# Patient Record
Sex: Male | Born: 1965 | Race: White | Hispanic: No | Smoking: Current every day smoker
Health system: Southern US, Community
[De-identification: ages and names within clinical notes are randomized; demographics above are authoritative.]

## PROBLEM LIST (undated history)

## (undated) DIAGNOSIS — R042 Hemoptysis: Secondary | ICD-10-CM

## (undated) DIAGNOSIS — R519 Headache, unspecified: Secondary | ICD-10-CM

## (undated) DIAGNOSIS — J40 Bronchitis, not specified as acute or chronic: Secondary | ICD-10-CM

## (undated) DIAGNOSIS — F1910 Other psychoactive substance abuse, uncomplicated: Secondary | ICD-10-CM

## (undated) DIAGNOSIS — F419 Anxiety disorder, unspecified: Secondary | ICD-10-CM

## (undated) DIAGNOSIS — Z87442 Personal history of urinary calculi: Secondary | ICD-10-CM

## (undated) DIAGNOSIS — N400 Enlarged prostate without lower urinary tract symptoms: Secondary | ICD-10-CM

## (undated) DIAGNOSIS — N200 Calculus of kidney: Secondary | ICD-10-CM

## (undated) HISTORY — PX: NECK SURGERY: SHX720

## (undated) HISTORY — PX: OTHER SURGICAL HISTORY: SHX169

## (undated) HISTORY — DX: Hemoptysis: R04.2

---

## 1996-02-01 HISTORY — PX: NECK SURGERY: SHX720

## 1997-01-31 HISTORY — PX: NECK SURGERY: SHX720

## 1997-12-31 ENCOUNTER — Observation Stay (HOSPITAL_COMMUNITY): Admission: EM | Admit: 1997-12-31 | Discharge: 1998-01-01 | Payer: Self-pay | Admitting: Emergency Medicine

## 1998-06-11 ENCOUNTER — Emergency Department (HOSPITAL_COMMUNITY): Admission: EM | Admit: 1998-06-11 | Discharge: 1998-06-11 | Payer: Self-pay | Admitting: Emergency Medicine

## 1998-06-11 ENCOUNTER — Encounter: Payer: Self-pay | Admitting: Emergency Medicine

## 1998-12-29 ENCOUNTER — Encounter: Payer: Self-pay | Admitting: Orthopedic Surgery

## 1998-12-29 ENCOUNTER — Ambulatory Visit (HOSPITAL_COMMUNITY): Admission: RE | Admit: 1998-12-29 | Discharge: 1998-12-29 | Payer: Self-pay | Admitting: Orthopedic Surgery

## 1999-01-13 ENCOUNTER — Ambulatory Visit (HOSPITAL_COMMUNITY): Admission: RE | Admit: 1999-01-13 | Discharge: 1999-01-13 | Payer: Self-pay | Admitting: Orthopedic Surgery

## 1999-01-13 ENCOUNTER — Encounter: Payer: Self-pay | Admitting: Orthopedic Surgery

## 1999-02-05 ENCOUNTER — Inpatient Hospital Stay (HOSPITAL_COMMUNITY): Admission: RE | Admit: 1999-02-05 | Discharge: 1999-02-06 | Payer: Self-pay | Admitting: Neurosurgery

## 1999-02-05 ENCOUNTER — Encounter: Payer: Self-pay | Admitting: Neurosurgery

## 1999-02-22 ENCOUNTER — Encounter: Payer: Self-pay | Admitting: Neurosurgery

## 1999-02-22 ENCOUNTER — Encounter: Admission: RE | Admit: 1999-02-22 | Discharge: 1999-02-22 | Payer: Self-pay | Admitting: Neurosurgery

## 1999-03-17 ENCOUNTER — Encounter: Admission: RE | Admit: 1999-03-17 | Discharge: 1999-03-17 | Payer: Self-pay | Admitting: Neurosurgery

## 1999-03-17 ENCOUNTER — Encounter: Payer: Self-pay | Admitting: Neurosurgery

## 1999-04-19 ENCOUNTER — Encounter: Admission: RE | Admit: 1999-04-19 | Discharge: 1999-04-19 | Payer: Self-pay | Admitting: Neurosurgery

## 1999-04-19 ENCOUNTER — Encounter: Payer: Self-pay | Admitting: Neurosurgery

## 1999-04-26 ENCOUNTER — Encounter: Payer: Self-pay | Admitting: Emergency Medicine

## 1999-04-26 ENCOUNTER — Emergency Department (HOSPITAL_COMMUNITY): Admission: EM | Admit: 1999-04-26 | Discharge: 1999-04-26 | Payer: Self-pay | Admitting: Emergency Medicine

## 1999-05-26 ENCOUNTER — Encounter: Payer: Self-pay | Admitting: Neurosurgery

## 1999-05-26 ENCOUNTER — Ambulatory Visit (HOSPITAL_COMMUNITY): Admission: RE | Admit: 1999-05-26 | Discharge: 1999-05-26 | Payer: Self-pay | Admitting: Neurosurgery

## 1999-06-09 ENCOUNTER — Encounter: Payer: Self-pay | Admitting: Neurosurgery

## 1999-06-11 ENCOUNTER — Encounter: Payer: Self-pay | Admitting: Neurosurgery

## 1999-06-11 ENCOUNTER — Inpatient Hospital Stay (HOSPITAL_COMMUNITY): Admission: RE | Admit: 1999-06-11 | Discharge: 1999-06-12 | Payer: Self-pay | Admitting: Neurosurgery

## 1999-06-22 ENCOUNTER — Encounter: Payer: Self-pay | Admitting: Neurosurgery

## 1999-06-22 ENCOUNTER — Encounter: Admission: RE | Admit: 1999-06-22 | Discharge: 1999-06-22 | Payer: Self-pay | Admitting: Neurosurgery

## 1999-08-03 ENCOUNTER — Encounter: Admission: RE | Admit: 1999-08-03 | Discharge: 1999-08-03 | Payer: Self-pay | Admitting: Neurosurgery

## 1999-08-03 ENCOUNTER — Encounter: Payer: Self-pay | Admitting: Neurosurgery

## 1999-09-20 ENCOUNTER — Encounter: Payer: Self-pay | Admitting: Neurosurgery

## 1999-09-20 ENCOUNTER — Encounter: Admission: RE | Admit: 1999-09-20 | Discharge: 1999-09-20 | Payer: Self-pay | Admitting: Neurosurgery

## 2000-04-23 ENCOUNTER — Encounter: Payer: Self-pay | Admitting: Neurosurgery

## 2000-04-23 ENCOUNTER — Ambulatory Visit (HOSPITAL_COMMUNITY): Admission: RE | Admit: 2000-04-23 | Discharge: 2000-04-23 | Payer: Self-pay | Admitting: Neurosurgery

## 2000-05-03 ENCOUNTER — Emergency Department (HOSPITAL_COMMUNITY): Admission: EM | Admit: 2000-05-03 | Discharge: 2000-05-03 | Payer: Self-pay | Admitting: Emergency Medicine

## 2000-06-02 ENCOUNTER — Encounter: Admission: RE | Admit: 2000-06-02 | Discharge: 2000-06-02 | Payer: Self-pay | Admitting: Neurosurgery

## 2000-06-02 ENCOUNTER — Encounter: Payer: Self-pay | Admitting: Neurosurgery

## 2000-11-08 ENCOUNTER — Encounter: Payer: Self-pay | Admitting: Neurosurgery

## 2000-11-08 ENCOUNTER — Encounter: Admission: RE | Admit: 2000-11-08 | Discharge: 2000-11-08 | Payer: Self-pay | Admitting: Neurosurgery

## 2001-03-27 ENCOUNTER — Encounter: Payer: Self-pay | Admitting: General Practice

## 2001-03-27 ENCOUNTER — Encounter: Admission: RE | Admit: 2001-03-27 | Discharge: 2001-03-27 | Payer: Self-pay | Admitting: General Practice

## 2001-05-14 ENCOUNTER — Ambulatory Visit (HOSPITAL_COMMUNITY): Admission: RE | Admit: 2001-05-14 | Discharge: 2001-05-14 | Payer: Self-pay | Admitting: Internal Medicine

## 2001-05-14 ENCOUNTER — Encounter: Payer: Self-pay | Admitting: Internal Medicine

## 2001-09-29 ENCOUNTER — Emergency Department (HOSPITAL_COMMUNITY): Admission: EM | Admit: 2001-09-29 | Discharge: 2001-09-29 | Payer: Self-pay | Admitting: Emergency Medicine

## 2001-09-29 ENCOUNTER — Encounter: Payer: Self-pay | Admitting: Emergency Medicine

## 2001-10-04 ENCOUNTER — Emergency Department (HOSPITAL_COMMUNITY): Admission: EM | Admit: 2001-10-04 | Discharge: 2001-10-04 | Payer: Self-pay | Admitting: Emergency Medicine

## 2001-10-09 ENCOUNTER — Emergency Department (HOSPITAL_COMMUNITY): Admission: EM | Admit: 2001-10-09 | Discharge: 2001-10-09 | Payer: Self-pay | Admitting: Emergency Medicine

## 2001-11-15 ENCOUNTER — Emergency Department (HOSPITAL_COMMUNITY): Admission: EM | Admit: 2001-11-15 | Discharge: 2001-11-15 | Payer: Self-pay | Admitting: Emergency Medicine

## 2002-01-28 ENCOUNTER — Emergency Department (HOSPITAL_COMMUNITY): Admission: EM | Admit: 2002-01-28 | Discharge: 2002-01-28 | Payer: Self-pay

## 2002-03-20 ENCOUNTER — Emergency Department (HOSPITAL_COMMUNITY): Admission: EM | Admit: 2002-03-20 | Discharge: 2002-03-20 | Payer: Self-pay | Admitting: Emergency Medicine

## 2002-03-20 ENCOUNTER — Encounter: Payer: Self-pay | Admitting: Emergency Medicine

## 2002-06-26 ENCOUNTER — Emergency Department (HOSPITAL_COMMUNITY): Admission: EM | Admit: 2002-06-26 | Discharge: 2002-06-26 | Payer: Self-pay | Admitting: Emergency Medicine

## 2002-08-25 ENCOUNTER — Emergency Department (HOSPITAL_COMMUNITY): Admission: EM | Admit: 2002-08-25 | Discharge: 2002-08-25 | Payer: Self-pay | Admitting: Emergency Medicine

## 2003-01-08 ENCOUNTER — Emergency Department (HOSPITAL_COMMUNITY): Admission: EM | Admit: 2003-01-08 | Discharge: 2003-01-08 | Payer: Self-pay | Admitting: Emergency Medicine

## 2003-03-09 ENCOUNTER — Emergency Department (HOSPITAL_COMMUNITY): Admission: AD | Admit: 2003-03-09 | Discharge: 2003-03-09 | Payer: Self-pay | Admitting: Family Medicine

## 2003-04-01 ENCOUNTER — Emergency Department (HOSPITAL_COMMUNITY): Admission: EM | Admit: 2003-04-01 | Discharge: 2003-04-01 | Payer: Self-pay | Admitting: Family Medicine

## 2003-04-03 ENCOUNTER — Encounter: Admission: RE | Admit: 2003-04-03 | Discharge: 2003-04-10 | Payer: Self-pay | Admitting: Occupational Medicine

## 2003-04-26 ENCOUNTER — Emergency Department (HOSPITAL_COMMUNITY): Admission: AD | Admit: 2003-04-26 | Discharge: 2003-04-26 | Payer: Self-pay | Admitting: Family Medicine

## 2003-05-07 ENCOUNTER — Emergency Department (HOSPITAL_COMMUNITY): Admission: EM | Admit: 2003-05-07 | Discharge: 2003-05-07 | Payer: Self-pay

## 2003-06-20 ENCOUNTER — Emergency Department (HOSPITAL_COMMUNITY): Admission: EM | Admit: 2003-06-20 | Discharge: 2003-06-20 | Payer: Self-pay | Admitting: Emergency Medicine

## 2003-08-07 ENCOUNTER — Emergency Department (HOSPITAL_COMMUNITY): Admission: EM | Admit: 2003-08-07 | Discharge: 2003-08-07 | Payer: Self-pay | Admitting: Emergency Medicine

## 2004-01-27 ENCOUNTER — Ambulatory Visit (HOSPITAL_COMMUNITY): Admission: RE | Admit: 2004-01-27 | Discharge: 2004-01-27 | Payer: Self-pay | Admitting: Internal Medicine

## 2004-10-31 ENCOUNTER — Emergency Department (HOSPITAL_COMMUNITY): Admission: EM | Admit: 2004-10-31 | Discharge: 2004-10-31 | Payer: Self-pay | Admitting: Emergency Medicine

## 2004-11-10 ENCOUNTER — Emergency Department (HOSPITAL_COMMUNITY): Admission: EM | Admit: 2004-11-10 | Discharge: 2004-11-10 | Payer: Self-pay | Admitting: Emergency Medicine

## 2004-12-03 ENCOUNTER — Emergency Department (HOSPITAL_COMMUNITY): Admission: EM | Admit: 2004-12-03 | Discharge: 2004-12-03 | Payer: Self-pay | Admitting: Emergency Medicine

## 2005-04-29 ENCOUNTER — Emergency Department (HOSPITAL_COMMUNITY): Admission: EM | Admit: 2005-04-29 | Discharge: 2005-04-29 | Payer: Self-pay | Admitting: Family Medicine

## 2005-04-29 ENCOUNTER — Ambulatory Visit (HOSPITAL_COMMUNITY): Admission: RE | Admit: 2005-04-29 | Discharge: 2005-04-29 | Payer: Self-pay | Admitting: Family Medicine

## 2005-05-19 ENCOUNTER — Emergency Department (HOSPITAL_COMMUNITY): Admission: EM | Admit: 2005-05-19 | Discharge: 2005-05-19 | Payer: Self-pay | Admitting: Family Medicine

## 2005-05-29 ENCOUNTER — Emergency Department (HOSPITAL_COMMUNITY): Admission: EM | Admit: 2005-05-29 | Discharge: 2005-05-29 | Payer: Self-pay | Admitting: Emergency Medicine

## 2005-05-30 ENCOUNTER — Ambulatory Visit (HOSPITAL_COMMUNITY): Admission: RE | Admit: 2005-05-30 | Discharge: 2005-05-30 | Payer: Self-pay | Admitting: Family Medicine

## 2005-06-05 ENCOUNTER — Emergency Department (HOSPITAL_COMMUNITY): Admission: EM | Admit: 2005-06-05 | Discharge: 2005-06-05 | Payer: Self-pay | Admitting: Emergency Medicine

## 2005-06-06 ENCOUNTER — Emergency Department (HOSPITAL_COMMUNITY): Admission: EM | Admit: 2005-06-06 | Discharge: 2005-06-06 | Payer: Self-pay | Admitting: Family Medicine

## 2005-06-12 ENCOUNTER — Emergency Department (HOSPITAL_COMMUNITY): Admission: EM | Admit: 2005-06-12 | Discharge: 2005-06-12 | Payer: Self-pay | Admitting: Family Medicine

## 2005-06-26 ENCOUNTER — Emergency Department (HOSPITAL_COMMUNITY): Admission: EM | Admit: 2005-06-26 | Discharge: 2005-06-26 | Payer: Self-pay | Admitting: Emergency Medicine

## 2005-06-27 ENCOUNTER — Emergency Department (HOSPITAL_COMMUNITY): Admission: EM | Admit: 2005-06-27 | Discharge: 2005-06-27 | Payer: Self-pay | Admitting: Emergency Medicine

## 2006-08-10 ENCOUNTER — Emergency Department (HOSPITAL_COMMUNITY): Admission: EM | Admit: 2006-08-10 | Discharge: 2006-08-10 | Payer: Self-pay | Admitting: Emergency Medicine

## 2008-01-29 ENCOUNTER — Emergency Department (HOSPITAL_COMMUNITY): Admission: EM | Admit: 2008-01-29 | Discharge: 2008-01-29 | Payer: Self-pay | Admitting: Adult Health

## 2008-10-14 ENCOUNTER — Emergency Department (HOSPITAL_BASED_OUTPATIENT_CLINIC_OR_DEPARTMENT_OTHER): Admission: EM | Admit: 2008-10-14 | Discharge: 2008-10-14 | Payer: Self-pay | Admitting: Emergency Medicine

## 2008-10-14 ENCOUNTER — Ambulatory Visit: Payer: Self-pay | Admitting: Diagnostic Radiology

## 2008-10-19 ENCOUNTER — Ambulatory Visit: Payer: Self-pay | Admitting: Diagnostic Radiology

## 2008-10-19 ENCOUNTER — Emergency Department (HOSPITAL_BASED_OUTPATIENT_CLINIC_OR_DEPARTMENT_OTHER): Admission: EM | Admit: 2008-10-19 | Discharge: 2008-10-19 | Payer: Self-pay | Admitting: Emergency Medicine

## 2009-02-06 ENCOUNTER — Emergency Department (HOSPITAL_COMMUNITY): Admission: EM | Admit: 2009-02-06 | Discharge: 2009-02-06 | Payer: Self-pay | Admitting: Emergency Medicine

## 2009-04-19 ENCOUNTER — Emergency Department (HOSPITAL_BASED_OUTPATIENT_CLINIC_OR_DEPARTMENT_OTHER): Admission: EM | Admit: 2009-04-19 | Discharge: 2009-04-20 | Payer: Self-pay | Admitting: Emergency Medicine

## 2009-04-20 ENCOUNTER — Ambulatory Visit: Payer: Self-pay | Admitting: Interventional Radiology

## 2009-08-09 ENCOUNTER — Ambulatory Visit: Payer: Self-pay | Admitting: Diagnostic Radiology

## 2009-08-09 ENCOUNTER — Emergency Department (HOSPITAL_BASED_OUTPATIENT_CLINIC_OR_DEPARTMENT_OTHER)
Admission: EM | Admit: 2009-08-09 | Discharge: 2009-08-09 | Payer: Self-pay | Source: Home / Self Care | Admitting: Emergency Medicine

## 2009-09-30 ENCOUNTER — Emergency Department (HOSPITAL_BASED_OUTPATIENT_CLINIC_OR_DEPARTMENT_OTHER)
Admission: EM | Admit: 2009-09-30 | Discharge: 2009-09-30 | Payer: Self-pay | Source: Home / Self Care | Admitting: Emergency Medicine

## 2009-11-16 ENCOUNTER — Ambulatory Visit: Payer: Self-pay | Admitting: Diagnostic Radiology

## 2009-11-16 ENCOUNTER — Emergency Department (HOSPITAL_BASED_OUTPATIENT_CLINIC_OR_DEPARTMENT_OTHER)
Admission: EM | Admit: 2009-11-16 | Discharge: 2009-11-16 | Payer: Self-pay | Source: Home / Self Care | Admitting: Emergency Medicine

## 2009-11-23 ENCOUNTER — Emergency Department (HOSPITAL_BASED_OUTPATIENT_CLINIC_OR_DEPARTMENT_OTHER)
Admission: EM | Admit: 2009-11-23 | Discharge: 2009-11-23 | Payer: Self-pay | Source: Home / Self Care | Admitting: Emergency Medicine

## 2009-11-24 ENCOUNTER — Ambulatory Visit: Payer: Self-pay | Admitting: Family

## 2009-11-24 DIAGNOSIS — M79609 Pain in unspecified limb: Secondary | ICD-10-CM

## 2009-11-24 DIAGNOSIS — M542 Cervicalgia: Secondary | ICD-10-CM

## 2009-11-24 DIAGNOSIS — F411 Generalized anxiety disorder: Secondary | ICD-10-CM | POA: Insufficient documentation

## 2009-12-21 ENCOUNTER — Emergency Department (HOSPITAL_COMMUNITY): Admission: EM | Admit: 2009-12-21 | Discharge: 2009-12-21 | Payer: Self-pay | Admitting: Emergency Medicine

## 2009-12-23 ENCOUNTER — Encounter (INDEPENDENT_AMBULATORY_CARE_PROVIDER_SITE_OTHER): Payer: Self-pay | Admitting: *Deleted

## 2010-02-20 ENCOUNTER — Encounter: Payer: Self-pay | Admitting: Family Medicine

## 2010-02-21 ENCOUNTER — Encounter: Payer: Self-pay | Admitting: Orthopedic Surgery
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
## 2010-03-02 NOTE — Letter (Signed)
Summary: [place] No Show Letter  [place] [facility]  [address]   [place], [place] [address]   Phone: [phone]  Fax: [phone]    12/23/2009 MRN: [id number]  Darrell Matthews [address], [place]  [address]   Dear [name],   Our records indicate that you missed your scheduled appointment with Macario Carls on 12-21-2009.  Please contact this office to reschedule your appointment as soon as possible.  It is important that you keep your scheduled appointments with your physician, so we can provide you the best care possible.  Please be advised that there may be a charge for "no show" appointments.    Sincerely,   [place] at [facility]

## 2010-04-14 LAB — POCT CARDIAC MARKERS
CKMB, poc: <1 ng/mL — ABNORMAL LOW (ref 1.0–8.0)
Myoglobin, poc: 39.5 ng/mL (ref 12–200)
Troponin i, poc: <0.05 ng/mL (ref 0.00–0.09)

## 2010-04-14 LAB — POCT I-STAT, CHEM 8
BUN: 12 mg/dL (ref 6–23)
Calcium, Ion: 1.17 mmol/L (ref 1.12–1.32)
Chloride: 107 mEq/L (ref 96–112)
Creatinine, Ser: 0.8 mg/dL (ref 0.4–1.5)
Glucose, Bld: 99 mg/dL (ref 70–99)
HCT: 48 % (ref 39.0–52.0)
Hemoglobin: 16.3 g/dL (ref 13.0–17.0)
Potassium: 4.1 mEq/L (ref 3.5–5.1)
Sodium: 142 mEq/L (ref 135–145)
TCO2: 27 mmol/L (ref 0–100)

## 2010-04-18 LAB — SEDIMENTATION RATE: Sed Rate: 10 mm/hr (ref 0–16)

## 2010-05-07 ENCOUNTER — Emergency Department (HOSPITAL_BASED_OUTPATIENT_CLINIC_OR_DEPARTMENT_OTHER)
Admission: EM | Admit: 2010-05-07 | Discharge: 2010-05-07 | Disposition: A | Discharge status: Home / Self Care | Payer: Self-pay | Attending: Emergency Medicine | Admitting: Emergency Medicine

## 2010-05-07 DIAGNOSIS — G8929 Other chronic pain: Secondary | ICD-10-CM | POA: Insufficient documentation

## 2010-05-07 DIAGNOSIS — X58XXXA Exposure to other specified factors, initial encounter: Secondary | ICD-10-CM | POA: Insufficient documentation

## 2010-05-07 DIAGNOSIS — S335XXA Sprain of ligaments of lumbar spine, initial encounter: Secondary | ICD-10-CM | POA: Insufficient documentation

## 2010-05-07 DIAGNOSIS — Y93H2 Activity, gardening and landscaping: Secondary | ICD-10-CM | POA: Insufficient documentation

## 2010-05-07 DIAGNOSIS — Y92009 Unspecified place in unspecified non-institutional (private) residence as the place of occurrence of the external cause: Secondary | ICD-10-CM | POA: Insufficient documentation

## 2010-05-07 LAB — BASIC METABOLIC PANEL
BUN: 15 mg/dL (ref 6–23)
CO2: 29 mEq/L (ref 19–32)
Calcium: 9.6 mg/dL (ref 8.4–10.5)
Chloride: 105 mEq/L (ref 96–112)
Creatinine, Ser: 0.9 mg/dL (ref 0.4–1.5)
GFR calc Af Amer: >60 mL/min (ref >60)
GFR calc non Af Amer: >60 mL/min (ref >60)
Glucose, Bld: 94 mg/dL (ref 70–99)
Potassium: 4.3 mEq/L (ref 3.5–5.1)
Sodium: 140 mEq/L (ref 135–145)

## 2010-05-07 LAB — CBC
HCT: 43 % (ref 39.0–52.0)
Hemoglobin: 14.6 g/dL (ref 13.0–17.0)
MCHC: 34 g/dL (ref 30.0–36.0)
MCV: 90.7 fL (ref 78.0–100.0)
Platelets: 174 10*3/uL (ref 150–400)
RBC: 4.74 MIL/uL (ref 4.22–5.81)
RDW: 12.9 % (ref 11.5–15.5)
WBC: 10.4 10*3/uL (ref 4.0–10.5)

## 2010-05-07 LAB — DIFFERENTIAL
Basophils Relative: 1 % (ref 0–1)
Eosinophils Absolute: 0.1 10*3/uL (ref 0.0–0.7)
Lymphs Abs: 2.1 10*3/uL (ref 0.7–4.0)
Monocytes Absolute: 0.7 10*3/uL (ref 0.1–1.0)
Monocytes Relative: 7 % (ref 3–12)
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
## 2011-07-26 ENCOUNTER — Encounter (HOSPITAL_BASED_OUTPATIENT_CLINIC_OR_DEPARTMENT_OTHER): Payer: Self-pay

## 2011-07-26 ENCOUNTER — Emergency Department (HOSPITAL_BASED_OUTPATIENT_CLINIC_OR_DEPARTMENT_OTHER): Payer: Self-pay

## 2011-07-26 ENCOUNTER — Emergency Department (HOSPITAL_BASED_OUTPATIENT_CLINIC_OR_DEPARTMENT_OTHER)
Admission: EM | Admit: 2011-07-26 | Discharge: 2011-07-26 | Disposition: A | Discharge status: Home / Self Care | Payer: Self-pay | Attending: Emergency Medicine | Admitting: Emergency Medicine

## 2011-07-26 DIAGNOSIS — S61409A Unspecified open wound of unspecified hand, initial encounter: Secondary | ICD-10-CM | POA: Insufficient documentation

## 2011-07-26 DIAGNOSIS — S61411A Laceration without foreign body of right hand, initial encounter: Secondary | ICD-10-CM

## 2011-07-26 DIAGNOSIS — Y990 Civilian activity done for income or pay: Secondary | ICD-10-CM | POA: Insufficient documentation

## 2011-07-26 DIAGNOSIS — W268XXA Contact with other sharp object(s), not elsewhere classified, initial encounter: Secondary | ICD-10-CM | POA: Insufficient documentation

## 2011-07-26 MED ORDER — OXYCODONE-ACETAMINOPHEN 5-325 MG PO TABS: 1.0000 | ORAL_TABLET | ORAL | Status: AC | PRN

## 2011-07-26 MED ORDER — OXYCODONE-ACETAMINOPHEN 5-325 MG PO TABS
1.0000 | ORAL_TABLET | Freq: Once | ORAL | Status: AC
  Administered 2011-07-26: 1 via ORAL
  Filled 2011-07-26: qty 1

## 2011-07-26 MED ORDER — CEPHALEXIN 250 MG PO CAPS
500.0000 mg | ORAL_CAPSULE | Freq: Once | ORAL | Status: AC
Start: 2011-07-26 — End: 2011-07-26
  Administered 2011-07-26: 500 mg via ORAL
  Filled 2011-07-26: qty 2

## 2011-07-26 MED ORDER — CEPHALEXIN 500 MG PO CAPS: 500.0000 mg | ORAL_CAPSULE | Freq: Four times a day (QID) | ORAL | Status: AC

## 2011-07-26 NOTE — ED Notes (Signed)
Pt c/o Lac to R hand.  Pt states he cut knuckle on chrome band on sewer band last night around 11pm.  Bleeding controlled upon arrival.  Pt states last Tetanus shot was 3 years ago.

## 2011-07-26 NOTE — Discharge Instructions (Signed)
Laceration Care, Adult A laceration is a cut that goes through all layers of the skin. The cut goes into the tissue beneath the skin. HOME CARE For stitches (sutures) or staples:  Keep the cut clean and dry.   If you have a bandage (dressing), change it at least once a day. Change the bandage if it gets wet or dirty, or as told by your doctor.   Wash the cut with soap and water 2 times a day. Rinse the cut with water. Pat it dry with a clean towel.   Put a thin layer of medicated cream on the cut as told by your doctor.   You may shower after the first 24 hours. Do not soak the cut in water until the stitches are removed.   Only take medicines as told by your doctor.   Have your stitches or staples removed as told by your doctor.  For skin adhesive strips:  Keep the cut clean and dry.   Do not get the strips wet. You may take a bath, but be careful to keep the cut dry.   If the cut gets wet, pat it dry with a clean towel.   The strips will fall off on their own. Do not remove the strips that are still stuck to the cut.  For wound glue:  You may shower or take baths. Do not soak or scrub the cut. Do not swim. Avoid heavy sweating until the glue falls off on its own. After a shower or bath, pat the cut dry with a clean towel.   Do not put medicine on your cut until the glue falls off.   If you have a bandage, do not put tape over the glue.   Avoid lots of sunlight or tanning lamps until the glue falls off. Put sunscreen on the cut for the first year to reduce your scar.   The glue will fall off on its own. Do not pick at the glue.  You may need a tetanus shot if:  You cannot remember when you had your last tetanus shot.   You have never had a tetanus shot.  If you need a tetanus shot and you choose not to have one, you may get tetanus. Sickness from tetanus can be serious. GET HELP RIGHT AWAY IF:   Your pain does not get better with medicine.   Your arm, hand, leg, or  foot loses feeling (numbness) or changes color.   Your cut is bleeding.   Your joint feels weak, or you cannot use your joint.   You have painful lumps on your body.   Your cut is red, puffy (swollen), or painful.   You have a red line on the skin near the cut.   You have yellowish-white fluid (pus) coming from the cut.   You have a fever.   You have a bad smell coming from the cut or bandage.   Your cut breaks open before or after stitches are removed.   You notice something coming out of the cut, such as wood or glass.   You cannot move a finger or toe.  MAKE SURE YOU:   Understand these instructions.   Will watch your condition.   Will get help right away if you are not doing well or get worse.  Document Released: 07/06/2007 Document Revised: 01/06/2011 Document Reviewed: 07/13/2010 [facility] Patient Information 2012 [employer]   MR. Driggs, YOU HAVE AN INFECTED LACERATION ON THE RIGHT HAND.  YOU HAVE HAD A SPLINT APPLIED TO IMMOBILIZE YOUR HAND, AND HAVE BEEN GIVEN A SLING TO KEEP YOUR RIGHT HAND ELEVATED.  TAKE THE ANTIBIOTIC KEFLEX 500 MG FOUR TIMES PER DAY.  TAKE THE PAIN MEDICINE PERCOCET EVERY 4 HOURS IF NEEDED FOR PAIN.  CALL DR. MARK YATES' OFFICE THIS AFTERNOON TO MAKE AN APPOINTMENT FOR HIM TO SEE YOU TOMORROW.
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
## 2011-07-26 NOTE — ED Notes (Signed)
Patient transported to X-ray 

## 2011-08-11 ENCOUNTER — Emergency Department (HOSPITAL_BASED_OUTPATIENT_CLINIC_OR_DEPARTMENT_OTHER)
Admission: EM | Admit: 2011-08-11 | Discharge: 2011-08-11 | Disposition: A | Discharge status: Home / Self Care | Payer: Self-pay | Attending: Emergency Medicine | Admitting: Emergency Medicine

## 2011-08-11 ENCOUNTER — Emergency Department (HOSPITAL_BASED_OUTPATIENT_CLINIC_OR_DEPARTMENT_OTHER): Payer: Self-pay

## 2011-08-11 ENCOUNTER — Encounter (HOSPITAL_BASED_OUTPATIENT_CLINIC_OR_DEPARTMENT_OTHER): Payer: Self-pay

## 2011-08-11 DIAGNOSIS — M549 Dorsalgia, unspecified: Secondary | ICD-10-CM | POA: Insufficient documentation

## 2011-08-11 DIAGNOSIS — K859 Acute pancreatitis without necrosis or infection, unspecified: Secondary | ICD-10-CM | POA: Insufficient documentation

## 2011-08-11 DIAGNOSIS — R1011 Right upper quadrant pain: Secondary | ICD-10-CM | POA: Insufficient documentation

## 2011-08-11 DIAGNOSIS — R6883 Chills (without fever): Secondary | ICD-10-CM | POA: Insufficient documentation

## 2011-08-11 DIAGNOSIS — R110 Nausea: Secondary | ICD-10-CM | POA: Insufficient documentation

## 2011-08-11 DIAGNOSIS — F17200 Nicotine dependence, unspecified, uncomplicated: Secondary | ICD-10-CM | POA: Insufficient documentation

## 2011-08-11 LAB — CBC WITH DIFFERENTIAL/PLATELET
Eosinophils Absolute: 0 10*3/uL (ref 0.0–0.7)
Hemoglobin: 15.8 g/dL (ref 13.0–17.0)
Lymphocytes Relative: 15 % (ref 12–46)
Lymphs Abs: 2.1 10*3/uL (ref 0.7–4.0)
MCH: 31.6 pg (ref 26.0–34.0)
MCV: 90 fL (ref 78.0–100.0)
Monocytes Relative: 6 % (ref 3–12)
Neutrophils Relative %: 79 % — ABNORMAL HIGH (ref 43–77)
Platelets: 197 10*3/uL (ref 150–400)
RBC: 5 MIL/uL (ref 4.22–5.81)
WBC: 13.9 10*3/uL — ABNORMAL HIGH (ref 4.0–10.5)

## 2011-08-11 LAB — COMPREHENSIVE METABOLIC PANEL
ALT: 11 U/L (ref 0–53)
Alkaline Phosphatase: 81 U/L (ref 39–117)
BUN: 17 mg/dL (ref 6–23)
CO2: 25 mEq/L (ref 19–32)
GFR calc Af Amer: >90 mL/min (ref >90)
GFR calc non Af Amer: >90 mL/min (ref >90)
Glucose, Bld: 99 mg/dL (ref 70–99)
Potassium: 4 mEq/L (ref 3.5–5.1)
Sodium: 137 mEq/L (ref 135–145)
Total Bilirubin: 0.6 mg/dL (ref 0.3–1.2)
Total Protein: 7.3 g/dL (ref 6.0–8.3)

## 2011-08-11 LAB — URINALYSIS, ROUTINE W REFLEX MICROSCOPIC
Bilirubin Urine: NEGATIVE
Glucose, UA: NEGATIVE mg/dL
Protein, ur: NEGATIVE mg/dL
Urobilinogen, UA: 0.2 mg/dL (ref 0.0–1.0)

## 2011-08-11 LAB — URINE MICROSCOPIC-ADD ON

## 2011-08-11 LAB — LIPASE, BLOOD: Lipase: 113 U/L — ABNORMAL HIGH (ref 11–59)

## 2011-08-11 MED ORDER — ONDANSETRON HCL 4 MG/2ML IJ SOLN
4.0000 mg | Freq: Once | INTRAMUSCULAR | Status: AC
  Administered 2011-08-11: 4 mg via INTRAVENOUS
  Filled 2011-08-11: qty 2

## 2011-08-11 MED ORDER — OXYCODONE-ACETAMINOPHEN 5-325 MG PO TABS: 2.0000 | ORAL_TABLET | ORAL | Status: AC | PRN

## 2011-08-11 MED ORDER — SODIUM CHLORIDE 0.9 % IV SOLN
Freq: Once | INTRAVENOUS | Status: AC
  Administered 2011-08-11: 15:00:00 via INTRAVENOUS

## 2011-08-11 MED ORDER — HYDROMORPHONE HCL PF 1 MG/ML IJ SOLN
1.0000 mg | Freq: Once | INTRAMUSCULAR | Status: AC
  Administered 2011-08-11: 1 mg via INTRAVENOUS
  Filled 2011-08-11: qty 1

## 2011-08-11 MED ORDER — PROMETHAZINE HCL 25 MG PO TABS: 25.0000 mg | ORAL_TABLET | Freq: Four times a day (QID) | ORAL | Status: DC | PRN
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
## 2011-08-11 NOTE — ED Notes (Signed)
PA at bedside.

## 2011-08-11 NOTE — ED Notes (Signed)
Pt reports RUQ pain x 1 week.

## 2011-08-13 NOTE — ED Provider Notes (Signed)
Medical screening examination/treatment/procedure(s) were performed by non-physician practitioner and as supervising physician I was immediately available for consultation/collaboration.  Shelda Jakes, MD 08/13/11 [phone]

## 2011-10-18 ENCOUNTER — Emergency Department (HOSPITAL_COMMUNITY): Payer: Self-pay

## 2011-10-18 ENCOUNTER — Encounter (HOSPITAL_COMMUNITY): Payer: Self-pay | Admitting: *Deleted

## 2011-10-18 ENCOUNTER — Emergency Department (HOSPITAL_COMMUNITY)
Admission: EM | Admit: 2011-10-18 | Discharge: 2011-10-18 | Disposition: A | Discharge status: Home / Self Care | Payer: Self-pay | Attending: Emergency Medicine | Admitting: Emergency Medicine

## 2011-10-18 DIAGNOSIS — N201 Calculus of ureter: Secondary | ICD-10-CM | POA: Insufficient documentation

## 2011-10-18 DIAGNOSIS — N200 Calculus of kidney: Secondary | ICD-10-CM

## 2011-10-18 DIAGNOSIS — R10819 Abdominal tenderness, unspecified site: Secondary | ICD-10-CM | POA: Insufficient documentation

## 2011-10-18 DIAGNOSIS — R748 Abnormal levels of other serum enzymes: Secondary | ICD-10-CM | POA: Insufficient documentation

## 2011-10-18 DIAGNOSIS — R109 Unspecified abdominal pain: Secondary | ICD-10-CM | POA: Insufficient documentation

## 2011-10-18 HISTORY — DX: Benign prostatic hyperplasia without lower urinary tract symptoms: N40.0

## 2011-10-18 LAB — COMPREHENSIVE METABOLIC PANEL
AST: 12 U/L (ref 0–37)
Albumin: 3.6 g/dL (ref 3.5–5.2)
Alkaline Phosphatase: 70 U/L (ref 39–117)
BUN: 13 mg/dL (ref 6–23)
Chloride: 103 mEq/L (ref 96–112)
Potassium: 3.7 mEq/L (ref 3.5–5.1)
Total Protein: 6.7 g/dL (ref 6.0–8.3)

## 2011-10-18 LAB — URINALYSIS, ROUTINE W REFLEX MICROSCOPIC
Glucose, UA: NEGATIVE mg/dL
Ketones, ur: 15 mg/dL — AB
pH: 6 (ref 5.0–8.0)

## 2011-10-18 LAB — CBC WITH DIFFERENTIAL/PLATELET
Basophils Absolute: 0 10*3/uL (ref 0.0–0.1)
Basophils Relative: 0 % (ref 0–1)
Eosinophils Absolute: 0.1 10*3/uL (ref 0.0–0.7)
MCH: 31.3 pg (ref 26.0–34.0)
MCHC: 33.1 g/dL (ref 30.0–36.0)
Monocytes Relative: 6 % (ref 3–12)
Neutro Abs: 4.4 10*3/uL (ref 1.7–7.7)
Neutrophils Relative %: 64 % (ref 43–77)
Platelets: 157 10*3/uL (ref 150–400)
RDW: 13.9 % (ref 11.5–15.5)

## 2011-10-18 LAB — URINE MICROSCOPIC-ADD ON

## 2011-10-18 LAB — LIPASE, BLOOD: Lipase: 132 U/L — ABNORMAL HIGH (ref 11–59)

## 2011-10-18 MED ORDER — ONDANSETRON HCL 4 MG PO TABS: 4.0000 mg | ORAL_TABLET | Freq: Four times a day (QID) | ORAL | Status: DC

## 2011-10-18 MED ORDER — ONDANSETRON HCL 4 MG/2ML IJ SOLN
4.0000 mg | Freq: Once | INTRAMUSCULAR | Status: AC
Start: 2011-10-18 — End: 2011-10-18
  Administered 2011-10-18: 4 mg via INTRAVENOUS
  Filled 2011-10-18: qty 2

## 2011-10-18 MED ORDER — HYDROMORPHONE HCL PF 1 MG/ML IJ SOLN
1.0000 mg | Freq: Once | INTRAMUSCULAR | Status: AC
  Administered 2011-10-18: 1 mg via INTRAVENOUS
  Filled 2011-10-18: qty 1

## 2011-10-18 MED ORDER — FENTANYL CITRATE 0.05 MG/ML IJ SOLN
50.0000 ug | Freq: Once | INTRAMUSCULAR | Status: AC
  Administered 2011-10-18: 50 ug via INTRAVENOUS
  Filled 2011-10-18: qty 2

## 2011-10-18 MED ORDER — TAMSULOSIN HCL 0.4 MG PO CAPS: 0.4000 mg | ORAL_CAPSULE | Freq: Every day | ORAL | Status: DC

## 2011-10-18 MED ORDER — OXYCODONE-ACETAMINOPHEN 5-325 MG PO TABS
2.0000 | ORAL_TABLET | ORAL | Status: DC | PRN
Start: 2011-10-18 — End: 2012-03-02

## 2011-10-18 NOTE — ED Notes (Signed)
BJY:NWGN<FA> Expected date:10/18/11<BR> Expected time: 2:15 AM<BR> Means of arrival:Ambulance<BR> Comments:<BR> Flank pain; hematuria
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
## 2011-10-18 NOTE — ED Notes (Signed)
Pt aware that we need urine sample, st's he doesn't have to go right now.  Will check back with pt shortly.

## 2011-10-18 NOTE — ED Notes (Signed)
Per EMS:  Pt has R lower abdominal pain that radiates to his flank.  Painful on palpation, dull pain in the flank.  Hematuria present, no dysuria.  Pt st's this woke him up about 1.5 hours ago.  No n/v/d, no hx of kidney stones.  Pt in nad.

## 2011-10-19 LAB — URINE CULTURE: Culture: NO GROWTH

## 2012-03-02 ENCOUNTER — Encounter (HOSPITAL_COMMUNITY): Payer: Self-pay | Admitting: *Deleted

## 2012-03-02 ENCOUNTER — Emergency Department (HOSPITAL_COMMUNITY)
Admission: EM | Admit: 2012-03-02 | Discharge: 2012-03-03 | Disposition: A | Discharge status: Home / Self Care | Payer: Self-pay | Attending: Emergency Medicine | Admitting: Emergency Medicine

## 2012-03-02 DIAGNOSIS — Z7982 Long term (current) use of aspirin: Secondary | ICD-10-CM | POA: Insufficient documentation

## 2012-03-02 DIAGNOSIS — Z87442 Personal history of urinary calculi: Secondary | ICD-10-CM | POA: Insufficient documentation

## 2012-03-02 DIAGNOSIS — Z8659 Personal history of other mental and behavioral disorders: Secondary | ICD-10-CM | POA: Insufficient documentation

## 2012-03-02 DIAGNOSIS — N201 Calculus of ureter: Secondary | ICD-10-CM | POA: Insufficient documentation

## 2012-03-02 DIAGNOSIS — Z79899 Other long term (current) drug therapy: Secondary | ICD-10-CM | POA: Insufficient documentation

## 2012-03-02 DIAGNOSIS — N400 Enlarged prostate without lower urinary tract symptoms: Secondary | ICD-10-CM | POA: Insufficient documentation

## 2012-03-02 DIAGNOSIS — F17200 Nicotine dependence, unspecified, uncomplicated: Secondary | ICD-10-CM | POA: Insufficient documentation

## 2012-03-02 DIAGNOSIS — N23 Unspecified renal colic: Secondary | ICD-10-CM | POA: Insufficient documentation

## 2012-03-02 DIAGNOSIS — R110 Nausea: Secondary | ICD-10-CM | POA: Insufficient documentation

## 2012-03-02 HISTORY — DX: Anxiety disorder, unspecified: F41.9

## 2012-03-02 HISTORY — DX: Calculus of kidney: N20.0

## 2012-03-02 MED ORDER — FENTANYL CITRATE 0.05 MG/ML IJ SOLN
50.0000 ug | Freq: Once | INTRAMUSCULAR | Status: AC
  Administered 2012-03-02: 50 ug via INTRAVENOUS
  Filled 2012-03-02: qty 2

## 2012-03-02 NOTE — ED Notes (Signed)
[id number]<ON> Expected date:<BR> Expected time:<BR> Means of arrival:<BR> Comments:<BR> Medic, 47 M, Abd Pain

## 2012-03-02 NOTE — ED Notes (Signed)
PT to ED via [facility] from home; pt c/o abd pain x 3 days; began as rt flank pain now pain is Rt side of abd; pt describes pain as sharp, stabbing; pt states has a hx of kidney stones; denies urinary symptoms currently.

## 2012-03-03 ENCOUNTER — Emergency Department (HOSPITAL_COMMUNITY): Payer: Self-pay

## 2012-03-03 LAB — COMPREHENSIVE METABOLIC PANEL
ALT: 10 U/L (ref 0–53)
Alkaline Phosphatase: 68 U/L (ref 39–117)
CO2: 24 mEq/L (ref 19–32)
Chloride: 100 mEq/L (ref 96–112)
GFR calc Af Amer: 80 mL/min — ABNORMAL LOW (ref >90)
GFR calc non Af Amer: 69 mL/min — ABNORMAL LOW (ref >90)
Glucose, Bld: 95 mg/dL (ref 70–99)
Potassium: 3.9 mEq/L (ref 3.5–5.1)
Sodium: 135 mEq/L (ref 135–145)
Total Bilirubin: 0.3 mg/dL (ref 0.3–1.2)

## 2012-03-03 LAB — URINE MICROSCOPIC-ADD ON

## 2012-03-03 LAB — URINALYSIS, ROUTINE W REFLEX MICROSCOPIC
Nitrite: NEGATIVE
Specific Gravity, Urine: 1.01 (ref 1.005–1.030)
Urobilinogen, UA: 0.2 mg/dL (ref 0.0–1.0)
pH: 5.5 (ref 5.0–8.0)

## 2012-03-03 LAB — CBC WITH DIFFERENTIAL/PLATELET
Hemoglobin: 15.9 g/dL (ref 13.0–17.0)
Lymphocytes Relative: 20 % (ref 12–46)
Lymphs Abs: 2 10*3/uL (ref 0.7–4.0)
MCV: 93.4 fL (ref 78.0–100.0)
Neutrophils Relative %: 69 % (ref 43–77)
Platelets: 145 10*3/uL — ABNORMAL LOW (ref 150–400)
RBC: 4.88 MIL/uL (ref 4.22–5.81)
WBC: 10.1 10*3/uL (ref 4.0–10.5)

## 2012-03-03 MED ORDER — ONDANSETRON HCL 4 MG/2ML IJ SOLN
4.0000 mg | INTRAMUSCULAR | Status: DC | PRN
  Administered 2012-03-03: 4 mg via INTRAVENOUS
  Filled 2012-03-03: qty 2

## 2012-03-03 MED ORDER — KETOROLAC TROMETHAMINE 30 MG/ML IJ SOLN
30.0000 mg | Freq: Once | INTRAMUSCULAR | Status: AC
  Administered 2012-03-03: 30 mg via INTRAVENOUS
  Filled 2012-03-03: qty 1

## 2012-03-03 MED ORDER — NAPROXEN 250 MG PO TABS: 250.0000 mg | ORAL_TABLET | Freq: Two times a day (BID) | ORAL | Status: DC

## 2012-03-03 MED ORDER — HYDROMORPHONE HCL PF 1 MG/ML IJ SOLN
1.0000 mg | Freq: Once | INTRAMUSCULAR | Status: AC
  Administered 2012-03-03: 1 mg via INTRAVENOUS
  Filled 2012-03-03: qty 1

## 2012-03-03 MED ORDER — TAMSULOSIN HCL 0.4 MG PO CAPS: 0.4000 mg | ORAL_CAPSULE | Freq: Every day | ORAL | Status: DC

## 2012-03-03 MED ORDER — OXYCODONE-ACETAMINOPHEN 5-325 MG PO TABS: ORAL_TABLET | ORAL | Status: DC
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
## 2012-03-05 LAB — URINE CULTURE

## 2012-03-08 ENCOUNTER — Encounter (HOSPITAL_COMMUNITY): Payer: Self-pay | Admitting: *Deleted

## 2012-03-08 ENCOUNTER — Emergency Department (HOSPITAL_COMMUNITY)
Admission: EM | Admit: 2012-03-08 | Discharge: 2012-03-08 | Disposition: A | Discharge status: Home / Self Care | Payer: Self-pay | Attending: Emergency Medicine | Admitting: Emergency Medicine

## 2012-03-08 DIAGNOSIS — N400 Enlarged prostate without lower urinary tract symptoms: Secondary | ICD-10-CM | POA: Insufficient documentation

## 2012-03-08 DIAGNOSIS — Z79899 Other long term (current) drug therapy: Secondary | ICD-10-CM | POA: Insufficient documentation

## 2012-03-08 DIAGNOSIS — Z8659 Personal history of other mental and behavioral disorders: Secondary | ICD-10-CM | POA: Insufficient documentation

## 2012-03-08 DIAGNOSIS — N201 Calculus of ureter: Secondary | ICD-10-CM | POA: Insufficient documentation

## 2012-03-08 DIAGNOSIS — R110 Nausea: Secondary | ICD-10-CM | POA: Insufficient documentation

## 2012-03-08 DIAGNOSIS — M549 Dorsalgia, unspecified: Secondary | ICD-10-CM | POA: Insufficient documentation

## 2012-03-08 DIAGNOSIS — F17200 Nicotine dependence, unspecified, uncomplicated: Secondary | ICD-10-CM | POA: Insufficient documentation

## 2012-03-08 LAB — URINALYSIS, ROUTINE W REFLEX MICROSCOPIC
Glucose, UA: NEGATIVE mg/dL
Protein, ur: NEGATIVE mg/dL

## 2012-03-08 LAB — COMPREHENSIVE METABOLIC PANEL
ALT: 11 U/L (ref 0–53)
AST: 10 U/L (ref 0–37)
Albumin: 3.8 g/dL (ref 3.5–5.2)
Alkaline Phosphatase: 67 U/L (ref 39–117)
Chloride: 101 mEq/L (ref 96–112)
Potassium: 3.8 mEq/L (ref 3.5–5.1)
Sodium: 138 mEq/L (ref 135–145)
Total Bilirubin: 1.1 mg/dL (ref 0.3–1.2)
Total Protein: 7.6 g/dL (ref 6.0–8.3)

## 2012-03-08 LAB — CBC WITH DIFFERENTIAL/PLATELET
Basophils Relative: 1 % (ref 0–1)
Eosinophils Absolute: 0.1 10*3/uL (ref 0.0–0.7)
HCT: 44.9 % (ref 39.0–52.0)
MCV: 93.9 fL (ref 78.0–100.0)
Monocytes Relative: 8 % (ref 3–12)
Neutro Abs: 5.6 10*3/uL (ref 1.7–7.7)
Neutrophils Relative %: 64 % (ref 43–77)
Platelets: 172 10*3/uL (ref 150–400)
RDW: 13.4 % (ref 11.5–15.5)
WBC: 8.7 10*3/uL (ref 4.0–10.5)

## 2012-03-08 MED ORDER — OXYCODONE-ACETAMINOPHEN 5-325 MG PO TABS: 1.0000 | ORAL_TABLET | Freq: Four times a day (QID) | ORAL | Status: DC | PRN

## 2012-03-08 MED ORDER — SULFAMETHOXAZOLE-TMP DS 800-160 MG PO TABS
1.0000 | ORAL_TABLET | Freq: Once | ORAL | Status: AC
  Administered 2012-03-08: 1 via ORAL
  Filled 2012-03-08: qty 1

## 2012-03-08 MED ORDER — ONDANSETRON HCL 4 MG/2ML IJ SOLN
4.0000 mg | Freq: Once | INTRAMUSCULAR | Status: AC
  Administered 2012-03-08: 4 mg via INTRAVENOUS
  Filled 2012-03-08: qty 2

## 2012-03-08 MED ORDER — TAMSULOSIN HCL 0.4 MG PO CAPS: 0.4000 mg | ORAL_CAPSULE | Freq: Every day | ORAL | Status: DC

## 2012-03-08 MED ORDER — OXYCODONE-ACETAMINOPHEN 5-325 MG PO TABS
2.0000 | ORAL_TABLET | Freq: Once | ORAL | Status: AC
  Administered 2012-03-08: 2 via ORAL
  Filled 2012-03-08: qty 2

## 2012-03-08 MED ORDER — HYDROMORPHONE HCL PF 1 MG/ML IJ SOLN
1.0000 mg | Freq: Once | INTRAMUSCULAR | Status: AC
  Administered 2012-03-08: 1 mg via INTRAVENOUS
  Filled 2012-03-08: qty 1

## 2012-03-08 MED ORDER — SULFAMETHOXAZOLE-TRIMETHOPRIM 800-160 MG PO TABS: 1.0000 | ORAL_TABLET | Freq: Two times a day (BID) | ORAL | Status: DC

## 2012-03-08 MED ORDER — ONDANSETRON HCL 4 MG PO TABS: 4.0000 mg | ORAL_TABLET | Freq: Four times a day (QID) | ORAL | Status: DC

## 2012-03-08 NOTE — ED Notes (Signed)
Pt seen in ED for rib pain recently, told his pancreas was swollen. At present pt has left sided abdominal pain and nausea.
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
## 2012-03-10 LAB — URINE CULTURE
Colony Count: NO GROWTH
Culture: NO GROWTH

## 2012-03-10 NOTE — ED Provider Notes (Signed)
Medical screening examination/treatment/procedure(s) were performed by non-physician practitioner and as supervising physician I was immediately available for consultation/collaboration.   Laray Anger, DO 03/10/12 [phone]

## 2012-07-16 ENCOUNTER — Emergency Department (HOSPITAL_BASED_OUTPATIENT_CLINIC_OR_DEPARTMENT_OTHER): Payer: Self-pay

## 2012-07-16 ENCOUNTER — Encounter (HOSPITAL_BASED_OUTPATIENT_CLINIC_OR_DEPARTMENT_OTHER): Payer: Self-pay | Admitting: *Deleted

## 2012-07-16 ENCOUNTER — Emergency Department (HOSPITAL_BASED_OUTPATIENT_CLINIC_OR_DEPARTMENT_OTHER)
Admission: EM | Admit: 2012-07-16 | Discharge: 2012-07-16 | Disposition: A | Discharge status: Home / Self Care | Payer: Self-pay | Attending: Emergency Medicine | Admitting: Emergency Medicine

## 2012-07-16 DIAGNOSIS — Z87442 Personal history of urinary calculi: Secondary | ICD-10-CM | POA: Insufficient documentation

## 2012-07-16 DIAGNOSIS — Y929 Unspecified place or not applicable: Secondary | ICD-10-CM | POA: Insufficient documentation

## 2012-07-16 DIAGNOSIS — S60212A Contusion of left wrist, initial encounter: Secondary | ICD-10-CM

## 2012-07-16 DIAGNOSIS — Z7982 Long term (current) use of aspirin: Secondary | ICD-10-CM | POA: Insufficient documentation

## 2012-07-16 DIAGNOSIS — Y9389 Activity, other specified: Secondary | ICD-10-CM | POA: Insufficient documentation

## 2012-07-16 DIAGNOSIS — S93402A Sprain of unspecified ligament of left ankle, initial encounter: Secondary | ICD-10-CM

## 2012-07-16 DIAGNOSIS — F17200 Nicotine dependence, unspecified, uncomplicated: Secondary | ICD-10-CM | POA: Insufficient documentation

## 2012-07-16 DIAGNOSIS — Z8659 Personal history of other mental and behavioral disorders: Secondary | ICD-10-CM | POA: Insufficient documentation

## 2012-07-16 DIAGNOSIS — S60219A Contusion of unspecified wrist, initial encounter: Secondary | ICD-10-CM | POA: Insufficient documentation

## 2012-07-16 DIAGNOSIS — S93409A Sprain of unspecified ligament of unspecified ankle, initial encounter: Secondary | ICD-10-CM | POA: Insufficient documentation

## 2012-07-16 DIAGNOSIS — N400 Enlarged prostate without lower urinary tract symptoms: Secondary | ICD-10-CM | POA: Insufficient documentation

## 2012-07-16 DIAGNOSIS — IMO0002 Reserved for concepts with insufficient information to code with codable children: Secondary | ICD-10-CM | POA: Insufficient documentation

## 2012-07-16 MED ORDER — IBUPROFEN 800 MG PO TABS
800.0000 mg | ORAL_TABLET | Freq: Once | ORAL | Status: AC
  Administered 2012-07-16: 800 mg via ORAL
  Filled 2012-07-16: qty 1

## 2012-07-16 MED ORDER — IBUPROFEN 800 MG PO TABS: 800.0000 mg | ORAL_TABLET | Freq: Three times a day (TID) | ORAL | Status: DC

## 2012-07-16 NOTE — ED Notes (Signed)
Pt c/o left ankle and left wrist injury  X 4 hrs ago
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
## 2012-09-16 ENCOUNTER — Emergency Department (HOSPITAL_BASED_OUTPATIENT_CLINIC_OR_DEPARTMENT_OTHER): Payer: Self-pay

## 2012-09-16 ENCOUNTER — Encounter (HOSPITAL_BASED_OUTPATIENT_CLINIC_OR_DEPARTMENT_OTHER): Payer: Self-pay

## 2012-09-16 ENCOUNTER — Emergency Department (HOSPITAL_BASED_OUTPATIENT_CLINIC_OR_DEPARTMENT_OTHER)
Admission: EM | Admit: 2012-09-16 | Discharge: 2012-09-16 | Disposition: A | Discharge status: Home / Self Care | Payer: Self-pay | Attending: Emergency Medicine | Admitting: Emergency Medicine

## 2012-09-16 DIAGNOSIS — R109 Unspecified abdominal pain: Secondary | ICD-10-CM | POA: Insufficient documentation

## 2012-09-16 DIAGNOSIS — Z8659 Personal history of other mental and behavioral disorders: Secondary | ICD-10-CM | POA: Insufficient documentation

## 2012-09-16 DIAGNOSIS — Z87448 Personal history of other diseases of urinary system: Secondary | ICD-10-CM | POA: Insufficient documentation

## 2012-09-16 DIAGNOSIS — Z79899 Other long term (current) drug therapy: Secondary | ICD-10-CM | POA: Insufficient documentation

## 2012-09-16 DIAGNOSIS — F17200 Nicotine dependence, unspecified, uncomplicated: Secondary | ICD-10-CM | POA: Insufficient documentation

## 2012-09-16 DIAGNOSIS — Z87442 Personal history of urinary calculi: Secondary | ICD-10-CM | POA: Insufficient documentation

## 2012-09-16 LAB — CBC
HCT: 40.4 % (ref 39.0–52.0)
Platelets: 143 10*3/uL — ABNORMAL LOW (ref 150–400)
RDW: 13.1 % (ref 11.5–15.5)
WBC: 7.9 10*3/uL (ref 4.0–10.5)

## 2012-09-16 LAB — URINALYSIS, ROUTINE W REFLEX MICROSCOPIC
Glucose, UA: NEGATIVE mg/dL
Leukocytes, UA: NEGATIVE
Protein, ur: NEGATIVE mg/dL
Specific Gravity, Urine: 1.019 (ref 1.005–1.030)
Urobilinogen, UA: 0.2 mg/dL (ref 0.0–1.0)

## 2012-09-16 LAB — BASIC METABOLIC PANEL
Chloride: 105 mEq/L (ref 96–112)
GFR calc Af Amer: >90 mL/min (ref >90)
Potassium: 3.8 mEq/L (ref 3.5–5.1)

## 2012-09-16 MED ORDER — ONDANSETRON HCL 4 MG/2ML IJ SOLN
4.0000 mg | Freq: Once | INTRAMUSCULAR | Status: AC
  Administered 2012-09-16: 4 mg via INTRAVENOUS
  Filled 2012-09-16: qty 2

## 2012-09-16 MED ORDER — IBUPROFEN 600 MG PO TABS: 600.0000 mg | ORAL_TABLET | Freq: Four times a day (QID) | ORAL | Status: DC | PRN

## 2012-09-16 MED ORDER — CYCLOBENZAPRINE HCL 5 MG PO TABS
5.0000 mg | ORAL_TABLET | Freq: Three times a day (TID) | ORAL | Status: DC | PRN
Start: 2012-09-16 — End: 2012-10-08

## 2012-09-16 MED ORDER — HYDROMORPHONE HCL PF 1 MG/ML IJ SOLN
1.0000 mg | Freq: Once | INTRAMUSCULAR | Status: AC
  Administered 2012-09-16: 1 mg via INTRAVENOUS
  Filled 2012-09-16: qty 1

## 2012-09-16 MED ORDER — OXYCODONE-ACETAMINOPHEN 5-325 MG PO TABS: 1.0000 | ORAL_TABLET | Freq: Four times a day (QID) | ORAL | Status: DC | PRN

## 2012-09-16 NOTE — ED Notes (Signed)
Complaint of left flank pain, tender at palpation. Was told to have multiple kidney stones, hasn't pass any yet. Pain sharp, and stabbing, had to gradually roll out of bed this morning. Hematuria present, no dysuria. ABC intact. Thoughts coherent.
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
## 2012-10-08 ENCOUNTER — Encounter (HOSPITAL_BASED_OUTPATIENT_CLINIC_OR_DEPARTMENT_OTHER): Payer: Self-pay | Admitting: *Deleted

## 2012-10-08 ENCOUNTER — Emergency Department (HOSPITAL_BASED_OUTPATIENT_CLINIC_OR_DEPARTMENT_OTHER): Payer: Self-pay

## 2012-10-08 ENCOUNTER — Emergency Department (HOSPITAL_BASED_OUTPATIENT_CLINIC_OR_DEPARTMENT_OTHER)
Admission: EM | Admit: 2012-10-08 | Discharge: 2012-10-08 | Disposition: A | Discharge status: Home / Self Care | Payer: Self-pay | Attending: Emergency Medicine | Admitting: Emergency Medicine

## 2012-10-08 DIAGNOSIS — N400 Enlarged prostate without lower urinary tract symptoms: Secondary | ICD-10-CM | POA: Insufficient documentation

## 2012-10-08 DIAGNOSIS — S91331A Puncture wound without foreign body, right foot, initial encounter: Secondary | ICD-10-CM

## 2012-10-08 DIAGNOSIS — F17200 Nicotine dependence, unspecified, uncomplicated: Secondary | ICD-10-CM | POA: Insufficient documentation

## 2012-10-08 DIAGNOSIS — Y939 Activity, unspecified: Secondary | ICD-10-CM | POA: Insufficient documentation

## 2012-10-08 DIAGNOSIS — Z87442 Personal history of urinary calculi: Secondary | ICD-10-CM | POA: Insufficient documentation

## 2012-10-08 DIAGNOSIS — Y929 Unspecified place or not applicable: Secondary | ICD-10-CM | POA: Insufficient documentation

## 2012-10-08 DIAGNOSIS — W268XXA Contact with other sharp object(s), not elsewhere classified, initial encounter: Secondary | ICD-10-CM | POA: Insufficient documentation

## 2012-10-08 DIAGNOSIS — S91309A Unspecified open wound, unspecified foot, initial encounter: Secondary | ICD-10-CM | POA: Insufficient documentation

## 2012-10-08 DIAGNOSIS — Z8659 Personal history of other mental and behavioral disorders: Secondary | ICD-10-CM | POA: Insufficient documentation

## 2012-10-08 MED ORDER — IBUPROFEN 800 MG PO TABS: 800.0000 mg | ORAL_TABLET | Freq: Three times a day (TID) | ORAL | Status: DC

## 2012-10-08 MED ORDER — CIPROFLOXACIN HCL 500 MG PO TABS: 500.0000 mg | ORAL_TABLET | Freq: Two times a day (BID) | ORAL | Status: DC

## 2012-10-08 MED ORDER — CIPROFLOXACIN HCL 500 MG PO TABS
500.0000 mg | ORAL_TABLET | Freq: Once | ORAL | Status: AC
  Administered 2012-10-08: 500 mg via ORAL
  Filled 2012-10-08: qty 1
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
## 2012-10-08 NOTE — ED Notes (Signed)
Stepped on nail 45 min pta right foot

## 2013-08-23 ENCOUNTER — Emergency Department (HOSPITAL_BASED_OUTPATIENT_CLINIC_OR_DEPARTMENT_OTHER)
Admission: EM | Admit: 2013-08-23 | Discharge: 2013-08-23 | Disposition: A | Discharge status: Home / Self Care | Payer: Self-pay | Attending: Emergency Medicine | Admitting: Emergency Medicine

## 2013-08-23 ENCOUNTER — Encounter (HOSPITAL_BASED_OUTPATIENT_CLINIC_OR_DEPARTMENT_OTHER): Payer: Self-pay | Admitting: Emergency Medicine

## 2013-08-23 DIAGNOSIS — Z8659 Personal history of other mental and behavioral disorders: Secondary | ICD-10-CM | POA: Insufficient documentation

## 2013-08-23 DIAGNOSIS — Y9289 Other specified places as the place of occurrence of the external cause: Secondary | ICD-10-CM | POA: Insufficient documentation

## 2013-08-23 DIAGNOSIS — S90569A Insect bite (nonvenomous), unspecified ankle, initial encounter: Secondary | ICD-10-CM | POA: Insufficient documentation

## 2013-08-23 DIAGNOSIS — F17200 Nicotine dependence, unspecified, uncomplicated: Secondary | ICD-10-CM | POA: Insufficient documentation

## 2013-08-23 DIAGNOSIS — Z87448 Personal history of other diseases of urinary system: Secondary | ICD-10-CM | POA: Insufficient documentation

## 2013-08-23 DIAGNOSIS — Y9389 Activity, other specified: Secondary | ICD-10-CM | POA: Insufficient documentation

## 2013-08-23 DIAGNOSIS — Z87442 Personal history of urinary calculi: Secondary | ICD-10-CM | POA: Insufficient documentation

## 2013-08-23 DIAGNOSIS — Z792 Long term (current) use of antibiotics: Secondary | ICD-10-CM | POA: Insufficient documentation

## 2013-08-23 DIAGNOSIS — W57XXXA Bitten or stung by nonvenomous insect and other nonvenomous arthropods, initial encounter: Secondary | ICD-10-CM

## 2013-08-23 DIAGNOSIS — Z791 Long term (current) use of non-steroidal anti-inflammatories (NSAID): Secondary | ICD-10-CM | POA: Insufficient documentation

## 2013-08-23 MED ORDER — NAPROXEN 375 MG PO TABS: 375.0000 mg | ORAL_TABLET | Freq: Two times a day (BID) | ORAL | Status: DC

## 2013-08-23 MED ORDER — CLINDAMYCIN HCL 300 MG PO CAPS: 300.0000 mg | ORAL_CAPSULE | Freq: Four times a day (QID) | ORAL | Status: DC

## 2013-08-23 MED ORDER — OXYCODONE-ACETAMINOPHEN 5-325 MG PO TABS
2.0000 | ORAL_TABLET | Freq: Once | ORAL | Status: AC
  Administered 2013-08-23: 2 via ORAL
  Filled 2013-08-23: qty 2

## 2013-08-23 MED ORDER — OXYCODONE-ACETAMINOPHEN 5-325 MG PO TABS: 1.0000 | ORAL_TABLET | ORAL | Status: DC | PRN
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
## 2013-08-23 NOTE — Discharge Instructions (Signed)

## 2013-08-23 NOTE — ED Notes (Signed)
Pt c/o ?insect bite last Sunday, c/o sore area with scab to back of rt calf, tightness and pain noted

## 2013-12-11 ENCOUNTER — Encounter (HOSPITAL_BASED_OUTPATIENT_CLINIC_OR_DEPARTMENT_OTHER): Payer: Self-pay

## 2013-12-11 ENCOUNTER — Emergency Department (HOSPITAL_BASED_OUTPATIENT_CLINIC_OR_DEPARTMENT_OTHER)
Admission: EM | Admit: 2013-12-11 | Discharge: 2013-12-11 | Disposition: A | Discharge status: Home / Self Care | Payer: Self-pay | Attending: Emergency Medicine | Admitting: Emergency Medicine

## 2013-12-11 ENCOUNTER — Emergency Department (HOSPITAL_BASED_OUTPATIENT_CLINIC_OR_DEPARTMENT_OTHER): Payer: Self-pay

## 2013-12-11 DIAGNOSIS — Z8659 Personal history of other mental and behavioral disorders: Secondary | ICD-10-CM | POA: Insufficient documentation

## 2013-12-11 DIAGNOSIS — Z8709 Personal history of other diseases of the respiratory system: Secondary | ICD-10-CM | POA: Insufficient documentation

## 2013-12-11 DIAGNOSIS — R05 Cough: Secondary | ICD-10-CM | POA: Insufficient documentation

## 2013-12-11 DIAGNOSIS — R059 Cough, unspecified: Secondary | ICD-10-CM

## 2013-12-11 DIAGNOSIS — Z79899 Other long term (current) drug therapy: Secondary | ICD-10-CM | POA: Insufficient documentation

## 2013-12-11 DIAGNOSIS — Z87442 Personal history of urinary calculi: Secondary | ICD-10-CM | POA: Insufficient documentation

## 2013-12-11 DIAGNOSIS — Z720 Tobacco use: Secondary | ICD-10-CM | POA: Insufficient documentation

## 2013-12-11 DIAGNOSIS — Z87448 Personal history of other diseases of urinary system: Secondary | ICD-10-CM | POA: Insufficient documentation

## 2013-12-11 DIAGNOSIS — R42 Dizziness and giddiness: Secondary | ICD-10-CM | POA: Insufficient documentation

## 2013-12-11 HISTORY — DX: Bronchitis, not specified as acute or chronic: J40

## 2013-12-11 MED ORDER — PREDNISONE 50 MG PO TABS
60.0000 mg | ORAL_TABLET | ORAL | Status: AC
  Administered 2013-12-11: 60 mg via ORAL
  Filled 2013-12-11 (×2): qty 1

## 2013-12-11 MED ORDER — ALBUTEROL SULFATE HFA 108 (90 BASE) MCG/ACT IN AERS
1.0000 | INHALATION_SPRAY | RESPIRATORY_TRACT | Status: DC | PRN
  Administered 2013-12-11: 1 via RESPIRATORY_TRACT
  Filled 2013-12-11: qty 6.7

## 2013-12-11 MED ORDER — PREDNISONE 20 MG PO TABS: 60.0000 mg | ORAL_TABLET | Freq: Every day | ORAL | Status: AC

## 2013-12-11 NOTE — Patient Instructions (Signed)
Instructed patient on the proper use of administering albuterol mdi via aerochamber patient tolerated well 
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
## 2013-12-11 NOTE — ED Notes (Signed)
C/o cough x 1 week  Runny nose and states is wheezing at night per wife, sob at times

## 2013-12-11 NOTE — Discharge Instructions (Signed)
As discussed, your evaluation has been largely reassuring.  The single most important thing you can do is to stop smoking.  Otherwise, please use the provided inhaler every 4 hours for the next 2 days, then as needed for cough. Sure to take the prescribed medication as well. Please be sure to follow-up with a primary care physician.  Do not hesitate to return here for concerning changes in your condition.

## 2013-12-11 NOTE — ED Notes (Addendum)
nonprod cough x 1 week-bronchitis with abx approx 3 weeks ago-SOB and wheezing last night-NAD at this time

## 2014-04-12 IMAGING — CR DG FOOT COMPLETE 3+V*R*
3 series · 3 of 3 positions shown · non-contrast
Comparison: None.

CLINICAL DATA: Right foot wound, nail puncture

RIGHT FOOT COMPLETE - 3+ VIEW

[t foot ap right]
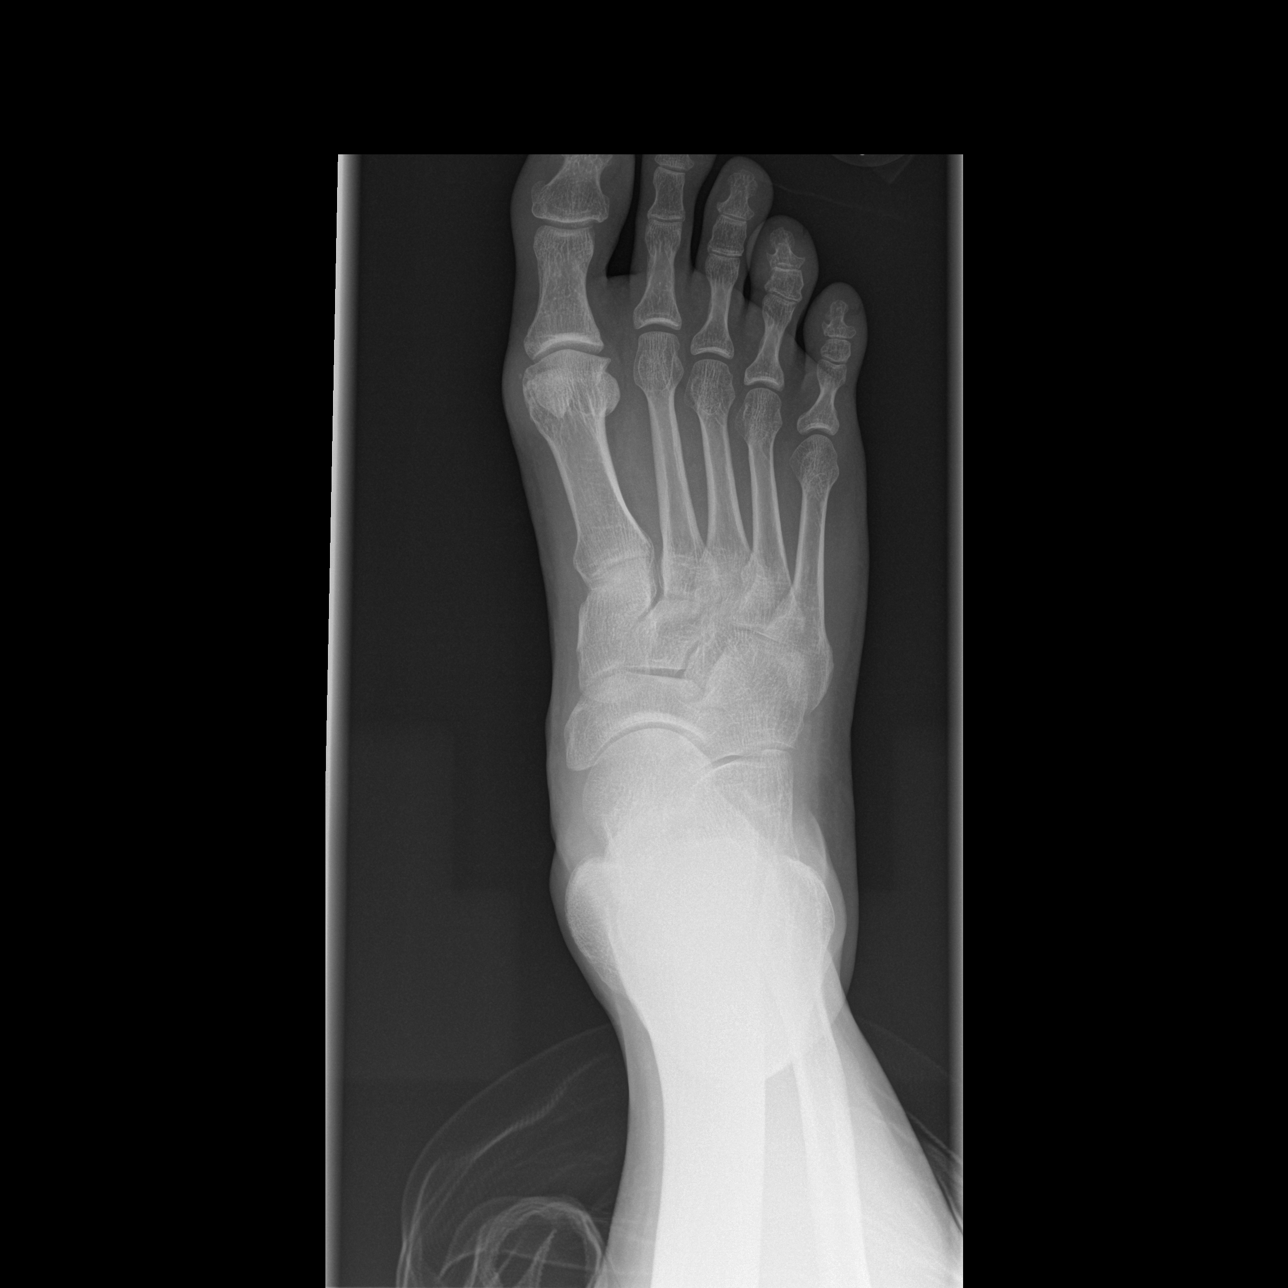

[t foot oblique right]
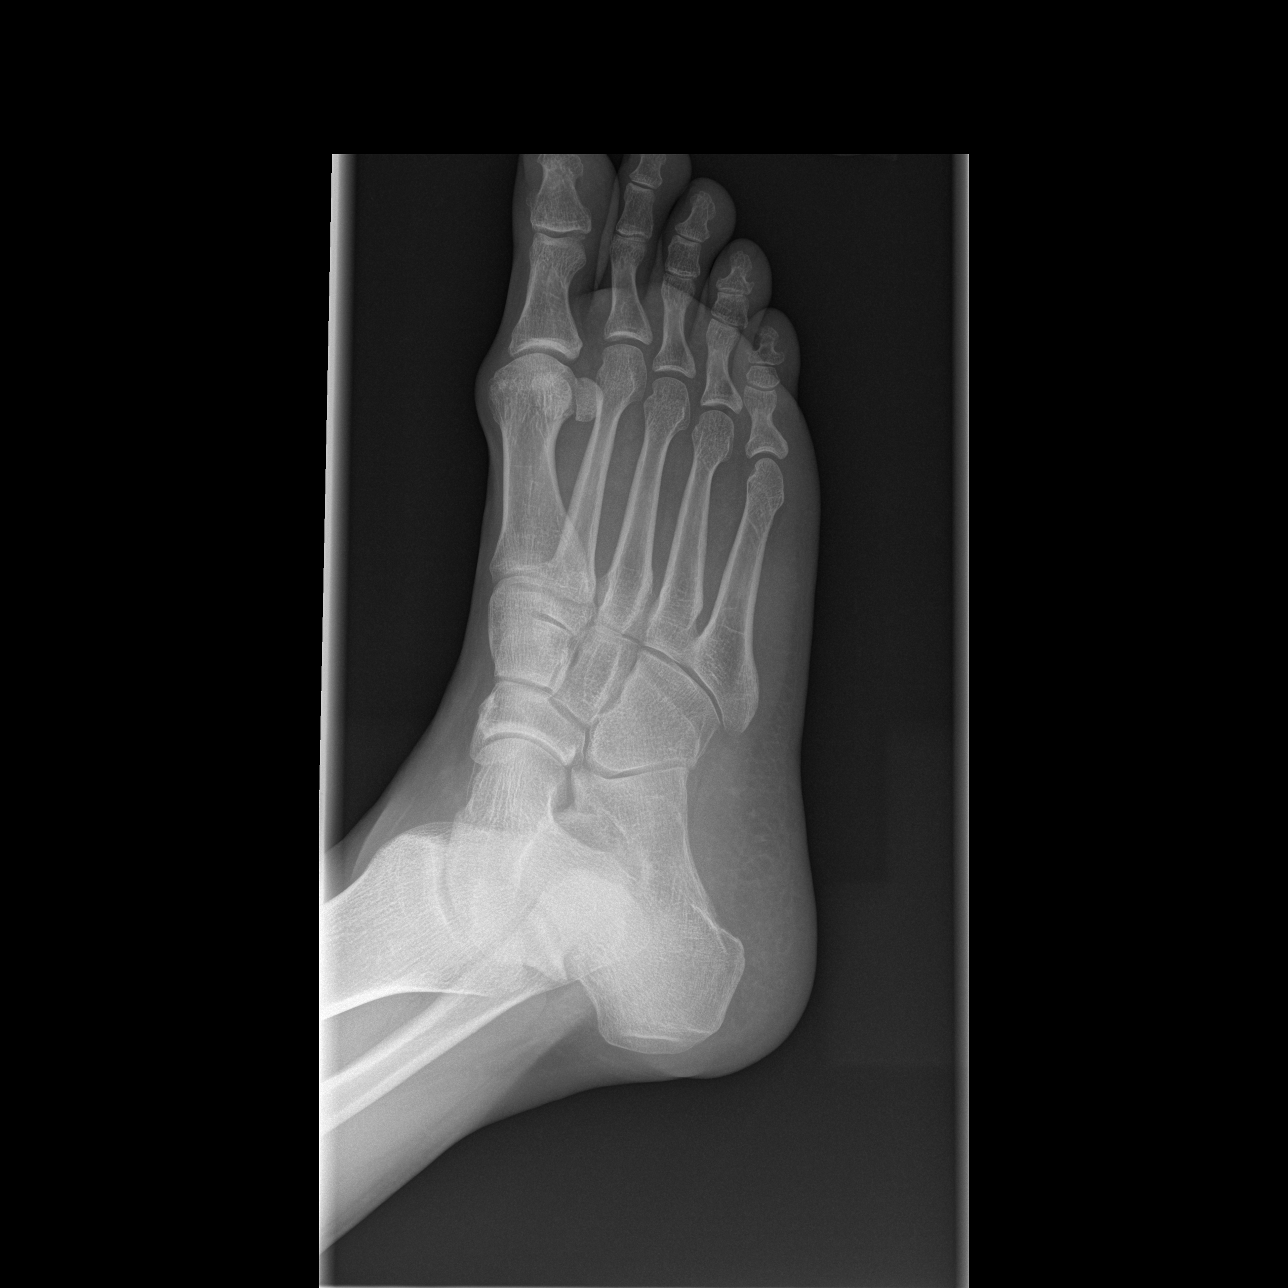

[t foot lat right]
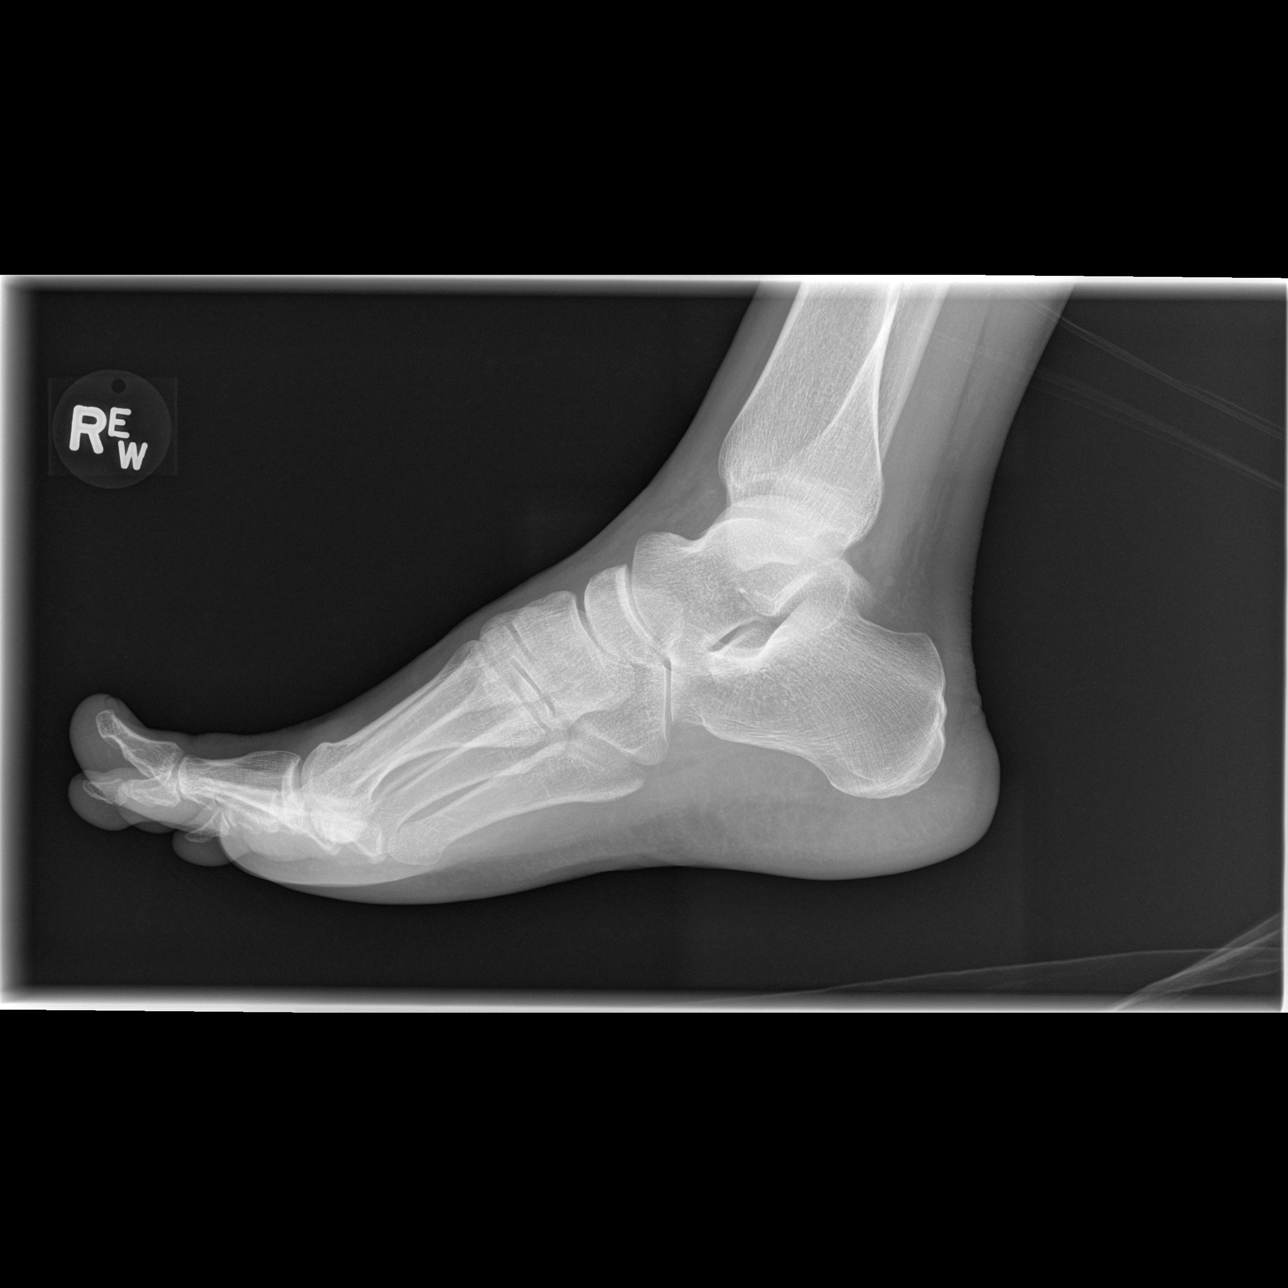

[3 of 3 positions shown; findings below may reference images not displayed]

FINDINGS: No acute fracture or dislocation is noted.  Soft tissue
swelling is noted beneath the calcaneus consistent with the given
clinical history. No radiopaque foreign body is noted.
IMPRESSION: Soft tissue changes without acute bony abnormality.

## 2015-03-07 ENCOUNTER — Emergency Department (HOSPITAL_BASED_OUTPATIENT_CLINIC_OR_DEPARTMENT_OTHER)
Admission: EM | Admit: 2015-03-07 | Discharge: 2015-03-07 | Disposition: A | Discharge status: Home / Self Care | Payer: Self-pay | Attending: Physician Assistant | Admitting: Physician Assistant

## 2015-03-07 ENCOUNTER — Emergency Department (HOSPITAL_BASED_OUTPATIENT_CLINIC_OR_DEPARTMENT_OTHER): Payer: Self-pay

## 2015-03-07 ENCOUNTER — Encounter (HOSPITAL_BASED_OUTPATIENT_CLINIC_OR_DEPARTMENT_OTHER): Payer: Self-pay | Admitting: Emergency Medicine

## 2015-03-07 DIAGNOSIS — Y9389 Activity, other specified: Secondary | ICD-10-CM | POA: Insufficient documentation

## 2015-03-07 DIAGNOSIS — Z8659 Personal history of other mental and behavioral disorders: Secondary | ICD-10-CM | POA: Insufficient documentation

## 2015-03-07 DIAGNOSIS — Z8709 Personal history of other diseases of the respiratory system: Secondary | ICD-10-CM | POA: Insufficient documentation

## 2015-03-07 DIAGNOSIS — Y998 Other external cause status: Secondary | ICD-10-CM | POA: Insufficient documentation

## 2015-03-07 DIAGNOSIS — F17210 Nicotine dependence, cigarettes, uncomplicated: Secondary | ICD-10-CM | POA: Insufficient documentation

## 2015-03-07 DIAGNOSIS — S199XXA Unspecified injury of neck, initial encounter: Secondary | ICD-10-CM | POA: Insufficient documentation

## 2015-03-07 DIAGNOSIS — Z87438 Personal history of other diseases of male genital organs: Secondary | ICD-10-CM | POA: Insufficient documentation

## 2015-03-07 DIAGNOSIS — Z87442 Personal history of urinary calculi: Secondary | ICD-10-CM | POA: Insufficient documentation

## 2015-03-07 DIAGNOSIS — R55 Syncope and collapse: Secondary | ICD-10-CM | POA: Insufficient documentation

## 2015-03-07 DIAGNOSIS — Y92410 Unspecified street and highway as the place of occurrence of the external cause: Secondary | ICD-10-CM | POA: Insufficient documentation

## 2015-03-07 MED ORDER — CYCLOBENZAPRINE HCL 10 MG PO TABS: 10.0000 mg | ORAL_TABLET | Freq: Two times a day (BID) | ORAL | Status: DC | PRN

## 2015-03-07 MED ORDER — CYCLOBENZAPRINE HCL 10 MG PO TABS
10.0000 mg | ORAL_TABLET | Freq: Once | ORAL | Status: AC
Start: 2015-03-07 — End: 2015-03-07
  Administered 2015-03-07: 10 mg via ORAL
  Filled 2015-03-07: qty 1

## 2015-03-07 MED ORDER — IBUPROFEN 800 MG PO TABS: 800.0000 mg | ORAL_TABLET | Freq: Three times a day (TID) | ORAL | Status: DC
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
## 2015-04-08 ENCOUNTER — Encounter (HOSPITAL_COMMUNITY): Payer: Self-pay

## 2015-04-08 ENCOUNTER — Emergency Department (HOSPITAL_COMMUNITY)
Admission: EM | Admit: 2015-04-08 | Discharge: 2015-04-08 | Disposition: A | Discharge status: Home / Self Care | Payer: No Typology Code available for payment source | Attending: Emergency Medicine | Admitting: Emergency Medicine

## 2015-04-08 DIAGNOSIS — R202 Paresthesia of skin: Secondary | ICD-10-CM | POA: Insufficient documentation

## 2015-04-08 DIAGNOSIS — R200 Anesthesia of skin: Secondary | ICD-10-CM | POA: Insufficient documentation

## 2015-04-08 DIAGNOSIS — Z8659 Personal history of other mental and behavioral disorders: Secondary | ICD-10-CM | POA: Insufficient documentation

## 2015-04-08 DIAGNOSIS — Z87440 Personal history of urinary (tract) infections: Secondary | ICD-10-CM | POA: Insufficient documentation

## 2015-04-08 DIAGNOSIS — F17210 Nicotine dependence, cigarettes, uncomplicated: Secondary | ICD-10-CM | POA: Insufficient documentation

## 2015-04-08 DIAGNOSIS — Z87438 Personal history of other diseases of male genital organs: Secondary | ICD-10-CM | POA: Insufficient documentation

## 2015-04-08 DIAGNOSIS — Z8709 Personal history of other diseases of the respiratory system: Secondary | ICD-10-CM | POA: Insufficient documentation

## 2015-04-08 DIAGNOSIS — M5412 Radiculopathy, cervical region: Secondary | ICD-10-CM | POA: Insufficient documentation

## 2015-04-08 MED ORDER — METHOCARBAMOL 500 MG PO TABS: 500.0000 mg | ORAL_TABLET | Freq: Two times a day (BID) | ORAL | Status: DC

## 2015-04-08 MED ORDER — PREDNISONE 20 MG PO TABS: ORAL_TABLET | ORAL | Status: DC

## 2015-04-08 MED ORDER — KETOROLAC TROMETHAMINE 60 MG/2ML IM SOLN
60.0000 mg | Freq: Once | INTRAMUSCULAR | Status: AC
  Administered 2015-04-08: 60 mg via INTRAMUSCULAR
  Filled 2015-04-08: qty 2

## 2015-04-08 MED ORDER — HYDROCODONE-ACETAMINOPHEN 5-325 MG PO TABS: 1.0000 | ORAL_TABLET | ORAL | Status: DC | PRN

## 2015-04-08 MED ORDER — DEXAMETHASONE SODIUM PHOSPHATE 10 MG/ML IJ SOLN
10.0000 mg | Freq: Once | INTRAMUSCULAR | Status: AC
  Administered 2015-04-08: 10 mg via INTRAMUSCULAR
  Filled 2015-04-08: qty 1

## 2015-04-08 NOTE — ED Notes (Signed)
MD at bedside. 
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
## 2015-04-08 NOTE — Discharge Instructions (Signed)

## 2015-05-29 ENCOUNTER — Emergency Department (HOSPITAL_COMMUNITY)
Admission: EM | Admit: 2015-05-29 | Discharge: 2015-05-29 | Disposition: A | Discharge status: Home / Self Care | Payer: No Typology Code available for payment source | Attending: Emergency Medicine | Admitting: Emergency Medicine

## 2015-05-29 ENCOUNTER — Encounter (HOSPITAL_COMMUNITY): Payer: Self-pay

## 2015-05-29 DIAGNOSIS — L84 Corns and callosities: Secondary | ICD-10-CM

## 2015-05-29 DIAGNOSIS — Y9389 Activity, other specified: Secondary | ICD-10-CM | POA: Insufficient documentation

## 2015-05-29 DIAGNOSIS — W57XXXA Bitten or stung by nonvenomous insect and other nonvenomous arthropods, initial encounter: Secondary | ICD-10-CM

## 2015-05-29 DIAGNOSIS — Z87442 Personal history of urinary calculi: Secondary | ICD-10-CM | POA: Insufficient documentation

## 2015-05-29 DIAGNOSIS — Z8659 Personal history of other mental and behavioral disorders: Secondary | ICD-10-CM | POA: Insufficient documentation

## 2015-05-29 DIAGNOSIS — S61211A Laceration without foreign body of left index finger without damage to nail, initial encounter: Secondary | ICD-10-CM | POA: Insufficient documentation

## 2015-05-29 DIAGNOSIS — Z8709 Personal history of other diseases of the respiratory system: Secondary | ICD-10-CM | POA: Insufficient documentation

## 2015-05-29 DIAGNOSIS — Y9289 Other specified places as the place of occurrence of the external cause: Secondary | ICD-10-CM | POA: Insufficient documentation

## 2015-05-29 DIAGNOSIS — Z23 Encounter for immunization: Secondary | ICD-10-CM | POA: Insufficient documentation

## 2015-05-29 DIAGNOSIS — W268XXA Contact with other sharp object(s), not elsewhere classified, initial encounter: Secondary | ICD-10-CM | POA: Insufficient documentation

## 2015-05-29 DIAGNOSIS — S61219A Laceration without foreign body of unspecified finger without damage to nail, initial encounter: Secondary | ICD-10-CM

## 2015-05-29 DIAGNOSIS — Y998 Other external cause status: Secondary | ICD-10-CM | POA: Insufficient documentation

## 2015-05-29 DIAGNOSIS — F17210 Nicotine dependence, cigarettes, uncomplicated: Secondary | ICD-10-CM | POA: Insufficient documentation

## 2015-05-29 DIAGNOSIS — S40262A Insect bite (nonvenomous) of left shoulder, initial encounter: Secondary | ICD-10-CM | POA: Insufficient documentation

## 2015-05-29 DIAGNOSIS — Z79899 Other long term (current) drug therapy: Secondary | ICD-10-CM | POA: Insufficient documentation

## 2015-05-29 MED ORDER — HYDROCODONE-ACETAMINOPHEN 5-325 MG PO TABS
2.0000 | ORAL_TABLET | Freq: Once | ORAL | Status: AC
  Administered 2015-05-29: 2 via ORAL
  Filled 2015-05-29: qty 2

## 2015-05-29 MED ORDER — DOXYCYCLINE HYCLATE 100 MG PO TABS
100.0000 mg | ORAL_TABLET | Freq: Once | ORAL | Status: AC
  Administered 2015-05-29: 100 mg via ORAL
  Filled 2015-05-29: qty 1

## 2015-05-29 MED ORDER — HYDROCODONE-ACETAMINOPHEN 5-325 MG PO TABS: ORAL_TABLET | ORAL | Status: DC

## 2015-05-29 MED ORDER — TETANUS-DIPHTH-ACELL PERTUSSIS 5-2.5-18.5 LF-MCG/0.5 IM SUSP
0.5000 mL | Freq: Once | INTRAMUSCULAR | Status: AC
  Administered 2015-05-29: 0.5 mL via INTRAMUSCULAR
  Filled 2015-05-29: qty 0.5

## 2015-05-29 MED ORDER — DOXYCYCLINE HYCLATE 100 MG PO CAPS: 100.0000 mg | ORAL_CAPSULE | Freq: Two times a day (BID) | ORAL | Status: DC

## 2015-05-29 NOTE — ED Notes (Signed)
Agree with PA assessment 

## 2015-05-29 NOTE — Discharge Instructions (Signed)
Wash the affected area with soap and water and apply a thin layer of topical antibiotic ointment. Do this every 12 hours.   Do not use rubbing alcohol or hydrogen peroxide.                        Look for signs of infection: if you see redness, if the area becomes warm, if pain increases sharply, there is discharge (pus), if red streaks appear or you develop fever or vomiting, RETURN immediately to the Emergency Department  for a recheck.   Take vicodin for breakthrough pain, do not drink alcohol, drive, care for children or do other critical tasks while taking vicodin.  Do not hesitate to return to the emergency room for any new, worsening or concerning symptoms.  Please obtain primary care using resource guide below. Let them know that you were seen in the emergency room and that they will need to obtain records for further outpatient management.    Corns and Calluses Corns are small areas of thickened skin that occur on the top, sides, or tip of a toe. They contain a cone-shaped core with a point that can press on a nerve below. This causes pain. Calluses are areas of thickened skin that can occur anywhere on the body including hands, fingers, palms, soles of the feet, and heels.Calluses are usually larger than corns.  CAUSES  Corns and calluses are caused by rubbing (friction) or pressure, such as from shoes that are too tight or do not fit properly.  RISK FACTORS Corns are more likely to develop in people who have toe deformities, such as hammer toes. Since calluses can occur with friction to any area of the skin, calluses are more likely to develop in people who:   Work with their hands.  Wear shoes that fit poorly, shoes that are too tight, or shoes that are high-heeled.  Have toes deformities. SYMPTOMS Symptoms of a corn or callus include:  A hard growth on the skin.   Pain or tenderness under the skin.   Redness and swelling.   Increased discomfort while wearing  tight-fitting shoes. DIAGNOSIS  Corns and calluses may be diagnosed with a medical history and physical exam.  TREATMENT  Corns and calluses may be treated with:  Removing the cause of the friction or pressure. This may include:  Changing your shoes.  Wearing shoe inserts (orthotics) or other protective layers in your shoes, such as a corn pad.  Wearing gloves.  Medicines to help soften skin in the hardened, thickened areas.  Reducing the size of the corn or callus by removing the dead layers of skin.  Antibiotic medicines to treat infection.  Surgery, if a toe deformity is the cause. HOME CARE INSTRUCTIONS   Take medicines only as directed by your health care provider.  If you were prescribed an antibiotic, finish all of it even if you start to feel better.  Wear shoes that fit well. Avoid wearing high-heeled shoes and shoes that are too tight or too loose.  Wear any padding, protective layers, gloves, or orthotics as directed by your health care provider.  Soak your hands or feet and then use a file or pumice stone to soften your corn or callus. Do this as directed by your health care provider.  Check your corn or callus every day for signs of infection. Watch for:  Redness, swelling, or pain.  Fluid, blood, or pus. SEEK MEDICAL CARE IF:   Your symptoms  do not improve with treatment.  You have increased redness, swelling, or pain at the site of your corn or callus.  You have fluid, blood, or pus coming from your corn or callus.  You have new symptoms.   This information is not intended to replace advice given to you by your health care provider. Make sure you discuss any questions you have with your health care provider.   Document Released: 10/24/2003 Document Revised: 06/03/2014 Document Reviewed: 01/13/2014 Elsevier Interactive Patient Education 2016 Elsevier Inc.  Laceration Care, Adult A laceration is a cut that goes through all of the layers of the skin  and into the tissue that is right under the skin. Some lacerations heal on their own. Others need to be closed with stitches (sutures), staples, skin adhesive strips, or skin glue. Proper laceration care minimizes the risk of infection and helps the laceration to heal better. HOW TO CARE FOR YOUR LACERATION If sutures or staples were used:  Keep the wound clean and dry.  If you were given a bandage (dressing), you should change it at least one time per day or as told by your health care provider. You should also change it if it becomes wet or dirty.  Keep the wound completely dry for the first 24 hours or as told by your health care provider. After that time, you may shower or bathe. However, make sure that the wound is not soaked in water until after the sutures or staples have been removed.  Clean the wound one time each day or as told by your health care provider:  Wash the wound with soap and water.  Rinse the wound with water to remove all soap.  Pat the wound dry with a clean towel. Do not rub the wound.  After cleaning the wound, apply a thin layer of antibiotic ointmentas told by your health care provider. This will help to prevent infection and keep the dressing from sticking to the wound.  Have the sutures or staples removed as told by your health care provider. If skin adhesive strips were used:  Keep the wound clean and dry.  If you were given a bandage (dressing), you should change it at least one time per day or as told by your health care provider. You should also change it if it becomes dirty or wet.  Do not get the skin adhesive strips wet. You may shower or bathe, but be careful to keep the wound dry.  If the wound gets wet, pat it dry with a clean towel. Do not rub the wound.  Skin adhesive strips fall off on their own. You may trim the strips as the wound heals. Do not remove skin adhesive strips that are still stuck to the wound. They will fall off in time. If  skin glue was used:  Try to keep the wound dry, but you may briefly wet it in the shower or bath. Do not soak the wound in water, such as by swimming.  After you have showered or bathed, gently pat the wound dry with a clean towel. Do not rub the wound.  Do not do any activities that will make you sweat heavily until the skin glue has fallen off on its own.  Do not apply liquid, cream, or ointment medicine to the wound while the skin glue is in place. Using those may loosen the film before the wound has healed.  If you were given a bandage (dressing), you should change it at  least one time per day or as told by your health care provider. You should also change it if it becomes dirty or wet.  If a dressing is placed over the wound, be careful not to apply tape directly over the skin glue. Doing that may cause the glue to be pulled off before the wound has healed.  Do not pick at the glue. The skin glue usually remains in place for 5-10 days, then it falls off of the skin. General Instructions  Take over-the-counter and prescription medicines only as told by your health care provider.  If you were prescribed an antibiotic medicine or ointment, take or apply it as told by your doctor. Do not stop using it even if your condition improves.  To help prevent scarring, make sure to cover your wound with sunscreen whenever you are outside after stitches are removed, after adhesive strips are removed, or when glue remains in place and the wound is healed. Make sure to wear a sunscreen of at least 30 SPF.  Do not scratch or pick at the wound.  Keep all follow-up visits as told by your health care provider. This is important.  Check your wound every day for signs of infection. Watch for:  Redness, swelling, or pain.  Fluid, blood, or pus.  Raise (elevate) the injured area above the level of your heart while you are sitting or lying down, if possible. SEEK MEDICAL CARE IF:  You received a  tetanus shot and you have swelling, severe pain, redness, or bleeding at the injection site.  You have a fever.  A wound that was closed breaks open.  You notice a bad smell coming from your wound or your dressing.  You notice something coming out of the wound, such as wood or glass.  Your pain is not controlled with medicine.  You have increased redness, swelling, or pain at the site of your wound.  You have fluid, blood, or pus coming from your wound.  You notice a change in the color of your skin near your wound.  You need to change the dressing frequently due to fluid, blood, or pus draining from the wound.  You develop a new rash.  You develop numbness around the wound. SEEK IMMEDIATE MEDICAL CARE IF:  You develop severe swelling around the wound.  Your pain suddenly increases and is severe.  You develop painful lumps near the wound or on skin that is anywhere on your body.  You have a red streak going away from your wound.  The wound is on your hand or foot and you cannot properly move a finger or toe.  The wound is on your hand or foot and you notice that your fingers or toes look pale or bluish.   This information is not intended to replace advice given to you by your health care provider. Make sure you discuss any questions you have with your health care provider.   Document Released: 01/17/2005 Document Revised: 06/03/2014 Document Reviewed: 01/13/2014 Elsevier Interactive Patient Education 2016 [employer].   [employer] Assistance [employer] 211 is a great source of information about community services available.  Access by dialing 2-1-1 from anywhere in [place], or by website -  PooledIncome.pl.   Other Local Resources (Updated 02/2015)  Financial Assistance   Services    Phone Number and Address  [facility]  Low-cost medical care - 1st and 3rd Saturday of every month  Must not qualify for  public  or private insurance and must have limited income [phone] [address] [place], [place]    [employer] of Social Services  Child care  Emergency assistance for housing and [employer]  Medicaid [phone] [address] [place], [place] [address]   [facility]  Low-cost medical care for children, communicable diseases, sexually-transmitted diseases, immunizations, maternity care, womens health and family planning [phone] [address] [place], [place] [address]  [facility]   Medication assistance for [facility] residents  Must meet income requirements [phone] [address] [place], [place].    [facility] Social Services  Child care  Emergency assistance for housing and [employer]  Medicaid [phone] [address] [place], [place] [address]  [facility]   Low-cost medical care,   Monday through Friday, 9 am to 6 pm.   Accepts Medicare/Medicaid, and self-pay [phone] [address] [place], [place] [address]  [facility]  Low-cost medical care - Monday through Friday, 8:30 am - 5:30 pm  Accepts Medicaid and self-pay [phone] [address] [place], [place] [address]   [facility]  Primary medical care, including for those with sickle cell disease  Accepts Medicare, Medicaid, insurance and self-pay [phone] [address] [place], [place]  [facility]   Primary medical care  Accepts Medicare, [place], insurance and self-pay [phone] [place], [place] [address]   [facility] of Social Services  Child care  Emergency assistance for housing and [employer]  Medicaid [phone] [address] [place], [place] [address]   [facility] Department of Health and [employer]  Child care  Emergency assistance for housing and [employer]  Medicaid [phone] [address] [place], [place] [address]   [facility] Medication Assistance Program  Medication assistance for [facility] residents with no insurance only  Must have a primary care doctor [phone] [address] [place], [place]  [facility]   Primary medical care  [place], [place], insurance  [phone] [address] [place], [place]  [facility]   Medication assistance [phone]  [facility]   Primary medical care  Accepts Medicare, [place], insurance and self-pay [phone] [address] [place], [place] [address]  [facility]   Primary medical care  Accepts Medicare, [place], insurance and self-pay [phone] [address] [place], [place] [address]  [facility]  For [place] residents between the ages of 63 and 68 who do not have any form of health insurance, Medicare, [place], or [place] benefits.  Services are provided free of charge to uninsured patients who fall within federal poverty guidelines.    Hours: Tuesdays and Thursdays, 4:15 - 8 pm [phone] [address] [place], [place] [address]  [facility]     Primary medical care  Dental care  Nutritional counseling  Pharmacy  Accepts Medicaid, Medicare, most insurance.  Fees are adjusted based on ability to pay.   [phone] [facility] [address] [place], [place]  [phone] [facility] [address] [place], [place]  [phone] [facility] [place], [place]  [phone] [facility], [address] [place], [place]  [phone] [facility] [address]  [place], [place]  [facility]  Womens health and family planning [phone] [address] [place], [place]  [facility] Department of Social Services  Child care  Emergency assistance for housing and [employer]  Medicaid [phone] [address] [place], [place] [address]   [facility]    Ages 38 and older  Hours: Mondays and Thursdays, 7:00 am - 9:00 am Patients are seen on a first come, first served basis. [phone], ext. [phone] [place], [place]  [facility] of Social Services  Child care  Emergency assistance for housing and [employer]  Medicaid [phone] [address] [place], [place] [address]  [facility]  Medication assistance  Rental assistance  Food pantry  Medication assistance  Housing assistance  Emergency food distribution  Utility assistance [phone] [place], [place]  [phone]  [address] [place], [place] [address] Hours: Tuesdays and Thursdays from 9am - 12 noon by appointment only  [phone] [address] [place], [place] [address]  [facility] Lanae Boast   Accepts private insurance, [place], and [place].  Payment is based on a sliding scale for those without insurance.  Hours: Mondays, Tuesdays and Thursdays, 8:30 am - 5:30 pm.   [phone] [address] [place], [place]  [facility] - Family Medicine at [facility], [place], and [place].  Payment is based on a sliding scale for those without insurance. [phone] [address] [place], [place]  [facility] - Pediatrics at E. Scientist, research (physical sciences), [employer], and [place].  Payment is based on a sliding scale for those without insurance [phone] [address], [employer], [place]  [facility] -  Pediatrics at [employer], [place], and [place].  Payment is based on a sliding scale for those without insurance. [phone] [address] [place], [place]  [facility] - Pediatrics at [place], [place], and [place].  Payment is based on a sliding scale for those without insurance. [phone], ext. [phone] [place], [place].    [facility]  Maternity care.  Accepts Medicaid and self-pay. [phone] [address] [place], [place]
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
## 2015-05-29 NOTE — ED Notes (Signed)
Pt reports corn to left pinky toe as well as a cut to his left index finger.  Pt also reports a tick bite to left upper back that occurred last night.  Pt main complaint is left foot pain.

## 2015-06-24 ENCOUNTER — Emergency Department (HOSPITAL_COMMUNITY)
Admission: EM | Admit: 2015-06-24 | Discharge: 2015-06-24 | Disposition: A | Discharge status: Home / Self Care | Payer: No Typology Code available for payment source | Attending: Emergency Medicine | Admitting: Emergency Medicine

## 2015-06-24 ENCOUNTER — Encounter (HOSPITAL_COMMUNITY): Payer: Self-pay | Admitting: Emergency Medicine

## 2015-06-24 ENCOUNTER — Emergency Department (HOSPITAL_COMMUNITY): Payer: No Typology Code available for payment source

## 2015-06-24 DIAGNOSIS — S5012XA Contusion of left forearm, initial encounter: Secondary | ICD-10-CM

## 2015-06-24 DIAGNOSIS — Z791 Long term (current) use of non-steroidal anti-inflammatories (NSAID): Secondary | ICD-10-CM | POA: Insufficient documentation

## 2015-06-24 DIAGNOSIS — F17210 Nicotine dependence, cigarettes, uncomplicated: Secondary | ICD-10-CM | POA: Insufficient documentation

## 2015-06-24 DIAGNOSIS — Y92410 Unspecified street and highway as the place of occurrence of the external cause: Secondary | ICD-10-CM | POA: Insufficient documentation

## 2015-06-24 DIAGNOSIS — Y939 Activity, unspecified: Secondary | ICD-10-CM | POA: Insufficient documentation

## 2015-06-24 DIAGNOSIS — M79632 Pain in left forearm: Secondary | ICD-10-CM

## 2015-06-24 DIAGNOSIS — Z7982 Long term (current) use of aspirin: Secondary | ICD-10-CM | POA: Insufficient documentation

## 2015-06-24 DIAGNOSIS — Y999 Unspecified external cause status: Secondary | ICD-10-CM | POA: Insufficient documentation

## 2015-06-24 MED ORDER — IBUPROFEN 600 MG PO TABS: 600.0000 mg | ORAL_TABLET | Freq: Three times a day (TID) | ORAL | Status: DC | PRN

## 2015-06-24 MED ORDER — IBUPROFEN 200 MG PO TABS
600.0000 mg | ORAL_TABLET | Freq: Once | ORAL | Status: AC
  Administered 2015-06-24: 600 mg via ORAL
  Filled 2015-06-24: qty 3

## 2015-06-24 MED ORDER — OXYCODONE-ACETAMINOPHEN 5-325 MG PO TABS
1.0000 | ORAL_TABLET | ORAL | Status: DC | PRN
  Administered 2015-06-24: 1 via ORAL
  Filled 2015-06-24: qty 1

## 2015-06-24 NOTE — Discharge Instructions (Signed)
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
## 2015-08-19 ENCOUNTER — Emergency Department (HOSPITAL_COMMUNITY)
Admission: EM | Admit: 2015-08-19 | Discharge: 2015-08-19 | Disposition: A | Discharge status: Home / Self Care | Payer: Self-pay | Attending: Emergency Medicine | Admitting: Emergency Medicine

## 2015-08-19 ENCOUNTER — Emergency Department (HOSPITAL_COMMUNITY): Payer: Self-pay

## 2015-08-19 ENCOUNTER — Encounter (HOSPITAL_COMMUNITY): Payer: Self-pay | Admitting: *Deleted

## 2015-08-19 DIAGNOSIS — F17210 Nicotine dependence, cigarettes, uncomplicated: Secondary | ICD-10-CM | POA: Insufficient documentation

## 2015-08-19 DIAGNOSIS — Z7982 Long term (current) use of aspirin: Secondary | ICD-10-CM | POA: Insufficient documentation

## 2015-08-19 DIAGNOSIS — T543X1A Toxic effect of corrosive alkalis and alkali-like substances, accidental (unintentional), initial encounter: Secondary | ICD-10-CM | POA: Insufficient documentation

## 2015-08-19 DIAGNOSIS — T5491XD Toxic effect of unspecified corrosive substance, accidental (unintentional), subsequent encounter: Secondary | ICD-10-CM

## 2015-08-19 DIAGNOSIS — Z189 Retained foreign body fragments, unspecified material: Secondary | ICD-10-CM | POA: Insufficient documentation

## 2015-08-19 MED ORDER — GI COCKTAIL ~~LOC~~
30.0000 mL | Freq: Once | ORAL | Status: AC
  Administered 2015-08-19: 30 mL via ORAL
  Filled 2015-08-19: qty 30
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
## 2015-08-19 NOTE — ED Notes (Signed)
Maryann w/ Poison Control recommends:  - If severe n/v/d and/or abdominal pain, consult GI - Monitor mouth & throat pain - Monitor of conjunctivitis d/t eye exposure

## 2015-08-19 NOTE — Discharge Instructions (Signed)
You should return for sudden worsening of pain, uncontrolled vomiting or diarrhea.

## 2015-10-14 ENCOUNTER — Emergency Department (HOSPITAL_COMMUNITY): Payer: Self-pay

## 2015-10-14 ENCOUNTER — Encounter (HOSPITAL_COMMUNITY): Payer: Self-pay

## 2015-10-14 ENCOUNTER — Emergency Department (HOSPITAL_COMMUNITY)
Admission: EM | Admit: 2015-10-14 | Discharge: 2015-10-14 | Disposition: A | Discharge status: Home / Self Care | Payer: Self-pay | Attending: Emergency Medicine | Admitting: Emergency Medicine

## 2015-10-14 DIAGNOSIS — F17210 Nicotine dependence, cigarettes, uncomplicated: Secondary | ICD-10-CM | POA: Insufficient documentation

## 2015-10-14 DIAGNOSIS — Y929 Unspecified place or not applicable: Secondary | ICD-10-CM | POA: Insufficient documentation

## 2015-10-14 DIAGNOSIS — S29012A Strain of muscle and tendon of back wall of thorax, initial encounter: Secondary | ICD-10-CM | POA: Insufficient documentation

## 2015-10-14 DIAGNOSIS — Y9389 Activity, other specified: Secondary | ICD-10-CM | POA: Insufficient documentation

## 2015-10-14 DIAGNOSIS — W1839XA Other fall on same level, initial encounter: Secondary | ICD-10-CM | POA: Insufficient documentation

## 2015-10-14 DIAGNOSIS — M25511 Pain in right shoulder: Secondary | ICD-10-CM | POA: Insufficient documentation

## 2015-10-14 DIAGNOSIS — Z7982 Long term (current) use of aspirin: Secondary | ICD-10-CM | POA: Insufficient documentation

## 2015-10-14 DIAGNOSIS — Y999 Unspecified external cause status: Secondary | ICD-10-CM | POA: Insufficient documentation

## 2015-10-14 MED ORDER — MELOXICAM 7.5 MG PO TABS: 7.5000 mg | ORAL_TABLET | Freq: Every day | ORAL | 0 refills | Status: DC | PRN

## 2015-10-14 MED ORDER — METHOCARBAMOL 500 MG PO TABS: 500.0000 mg | ORAL_TABLET | Freq: Four times a day (QID) | ORAL | 0 refills | Status: DC | PRN

## 2015-10-14 MED ORDER — LIDOCAINE 5 % EX PTCH: 1.0000 | MEDICATED_PATCH | CUTANEOUS | 0 refills | Status: DC

## 2015-10-14 MED ORDER — METHOCARBAMOL 500 MG PO TABS
1000.0000 mg | ORAL_TABLET | Freq: Once | ORAL | Status: AC
  Administered 2015-10-14: 1000 mg via ORAL
  Filled 2015-10-14: qty 2

## 2015-10-14 NOTE — Discharge Instructions (Signed)
Read the information below.  Use the prescribed medication as directed.  Please discuss all new medications with your pharmacist.  You may return to the Emergency Department at any time for worsening condition or any new symptoms that concern you.   If you develop uncontrolled pain, weakness or numbness of the extremity, severe discoloration of the skin, or you are unable to move your right arm, return to the ER for a recheck.
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
## 2015-10-14 NOTE — ED Notes (Signed)
PA at bedside.

## 2015-10-14 NOTE — ED Notes (Signed)
Patient transported to X-ray 
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
## 2016-03-28 ENCOUNTER — Encounter (HOSPITAL_COMMUNITY): Payer: Self-pay

## 2016-03-28 ENCOUNTER — Emergency Department (HOSPITAL_COMMUNITY)
Admission: EM | Admit: 2016-03-28 | Discharge: 2016-03-28 | Disposition: A | Discharge status: Home / Self Care | Payer: Self-pay | Attending: Emergency Medicine | Admitting: Emergency Medicine

## 2016-03-28 ENCOUNTER — Emergency Department (HOSPITAL_COMMUNITY): Payer: Self-pay

## 2016-03-28 DIAGNOSIS — Z79899 Other long term (current) drug therapy: Secondary | ICD-10-CM | POA: Insufficient documentation

## 2016-03-28 DIAGNOSIS — W19XXXA Unspecified fall, initial encounter: Secondary | ICD-10-CM

## 2016-03-28 DIAGNOSIS — Y999 Unspecified external cause status: Secondary | ICD-10-CM | POA: Insufficient documentation

## 2016-03-28 DIAGNOSIS — Y939 Activity, unspecified: Secondary | ICD-10-CM | POA: Insufficient documentation

## 2016-03-28 DIAGNOSIS — W1789XA Other fall from one level to another, initial encounter: Secondary | ICD-10-CM | POA: Insufficient documentation

## 2016-03-28 DIAGNOSIS — F17210 Nicotine dependence, cigarettes, uncomplicated: Secondary | ICD-10-CM | POA: Insufficient documentation

## 2016-03-28 DIAGNOSIS — S2232XA Fracture of one rib, left side, initial encounter for closed fracture: Secondary | ICD-10-CM | POA: Insufficient documentation

## 2016-03-28 DIAGNOSIS — Y929 Unspecified place or not applicable: Secondary | ICD-10-CM | POA: Insufficient documentation

## 2016-03-28 LAB — COMPREHENSIVE METABOLIC PANEL
ALBUMIN: 4.4 g/dL (ref 3.5–5.0)
ALT: 48 U/L (ref 17–63)
AST: 16 U/L (ref 15–41)
Alkaline Phosphatase: 96 U/L (ref 38–126)
Anion gap: 10 (ref 5–15)
BUN: 18 mg/dL (ref 6–20)
CO2: 22 mmol/L (ref 22–32)
CREATININE: 0.74 mg/dL (ref 0.61–1.24)
Calcium: 9.4 mg/dL (ref 8.9–10.3)
Chloride: 106 mmol/L (ref 101–111)
GFR calc Af Amer: >60 mL/min (ref >60)
GLUCOSE: 98 mg/dL (ref 65–99)
Potassium: 4.3 mmol/L (ref 3.5–5.1)
SODIUM: 138 mmol/L (ref 135–145)
Total Bilirubin: 0.8 mg/dL (ref 0.3–1.2)
Total Protein: 8.1 g/dL (ref 6.5–8.1)

## 2016-03-28 LAB — TYPE AND SCREEN
ABO/RH(D): A POS
Antibody Screen: NEGATIVE

## 2016-03-28 LAB — CBC
HCT: 45.2 % (ref 39.0–52.0)
Hemoglobin: 15.7 g/dL (ref 13.0–17.0)
MCH: 31.6 pg (ref 26.0–34.0)
MCHC: 34.7 g/dL (ref 30.0–36.0)
MCV: 90.9 fL (ref 78.0–100.0)
PLATELETS: 195 10*3/uL (ref 150–400)
RBC: 4.97 MIL/uL (ref 4.22–5.81)
RDW: 13.6 % (ref 11.5–15.5)
WBC: 12.5 10*3/uL — ABNORMAL HIGH (ref 4.0–10.5)

## 2016-03-28 LAB — PROTIME-INR
INR: 0.94
Prothrombin Time: 12.6 seconds (ref 11.4–15.2)

## 2016-03-28 LAB — POC OCCULT BLOOD, ED: Fecal Occult Bld: NEGATIVE

## 2016-03-28 LAB — ABO/RH: ABO/RH(D): A POS

## 2016-03-28 MED ORDER — HYDROMORPHONE HCL 2 MG/ML IJ SOLN
1.0000 mg | Freq: Once | INTRAMUSCULAR | Status: AC
  Administered 2016-03-28: 1 mg via INTRAVENOUS
  Filled 2016-03-28: qty 1

## 2016-03-28 MED ORDER — OXYCODONE-ACETAMINOPHEN 5-325 MG PO TABS
1.0000 | ORAL_TABLET | ORAL | Status: DC | PRN
  Administered 2016-03-28: 1 via ORAL
  Filled 2016-03-28: qty 1

## 2016-03-28 MED ORDER — ONDANSETRON HCL 4 MG/2ML IJ SOLN
4.0000 mg | Freq: Once | INTRAMUSCULAR | Status: AC
  Administered 2016-03-28: 4 mg via INTRAVENOUS
  Filled 2016-03-28: qty 2

## 2016-03-28 MED ORDER — IOPAMIDOL (ISOVUE-300) INJECTION 61%: 75.0000 mL | Freq: Once | INTRAVENOUS | Status: DC | PRN

## 2016-03-28 MED ORDER — MORPHINE SULFATE (PF) 4 MG/ML IV SOLN
4.0000 mg | Freq: Once | INTRAVENOUS | Status: AC
  Administered 2016-03-28: 4 mg via INTRAVENOUS
  Filled 2016-03-28: qty 1

## 2016-03-28 MED ORDER — OXYCODONE-ACETAMINOPHEN 5-325 MG PO TABS
1.0000 | ORAL_TABLET | ORAL | 0 refills | Status: DC | PRN
Start: 2016-03-28 — End: 2018-06-08

## 2016-03-28 MED ORDER — IOPAMIDOL (ISOVUE-300) INJECTION 61%
INTRAVENOUS | Status: AC
  Filled 2016-03-28: qty 75
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
## 2016-03-28 NOTE — ED Notes (Signed)
Assisted Dr.Haviland with hemoccult

## 2018-06-01 DIAGNOSIS — I350 Nonrheumatic aortic (valve) stenosis: Secondary | ICD-10-CM

## 2018-06-01 HISTORY — DX: Nonrheumatic aortic (valve) stenosis: I35.0

## 2018-06-08 ENCOUNTER — Other Ambulatory Visit: Payer: Self-pay

## 2018-06-08 ENCOUNTER — Encounter (HOSPITAL_BASED_OUTPATIENT_CLINIC_OR_DEPARTMENT_OTHER): Payer: Self-pay | Admitting: *Deleted

## 2018-06-08 ENCOUNTER — Emergency Department (HOSPITAL_BASED_OUTPATIENT_CLINIC_OR_DEPARTMENT_OTHER): Payer: Self-pay

## 2018-06-08 ENCOUNTER — Inpatient Hospital Stay (HOSPITAL_BASED_OUTPATIENT_CLINIC_OR_DEPARTMENT_OTHER)
Admission: EM | Admit: 2018-06-08 | Discharge: 2018-06-12 | DRG: 194 | Disposition: A | Discharge status: Home / Self Care | Payer: Self-pay | Attending: Internal Medicine | Admitting: Internal Medicine

## 2018-06-08 DIAGNOSIS — Z791 Long term (current) use of non-steroidal anti-inflammatories (NSAID): Secondary | ICD-10-CM

## 2018-06-08 DIAGNOSIS — J188 Other pneumonia, unspecified organism: Principal | ICD-10-CM | POA: Diagnosis present

## 2018-06-08 DIAGNOSIS — N400 Enlarged prostate without lower urinary tract symptoms: Secondary | ICD-10-CM | POA: Diagnosis present

## 2018-06-08 DIAGNOSIS — Z79899 Other long term (current) drug therapy: Secondary | ICD-10-CM

## 2018-06-08 DIAGNOSIS — R634 Abnormal weight loss: Secondary | ICD-10-CM

## 2018-06-08 DIAGNOSIS — R0902 Hypoxemia: Secondary | ICD-10-CM | POA: Diagnosis present

## 2018-06-08 DIAGNOSIS — J189 Pneumonia, unspecified organism: Secondary | ICD-10-CM

## 2018-06-08 DIAGNOSIS — R042 Hemoptysis: Secondary | ICD-10-CM

## 2018-06-08 DIAGNOSIS — Z20828 Contact with and (suspected) exposure to other viral communicable diseases: Secondary | ICD-10-CM | POA: Diagnosis present

## 2018-06-08 DIAGNOSIS — R1310 Dysphagia, unspecified: Secondary | ICD-10-CM

## 2018-06-08 DIAGNOSIS — Z87442 Personal history of urinary calculi: Secondary | ICD-10-CM

## 2018-06-08 DIAGNOSIS — J40 Bronchitis, not specified as acute or chronic: Secondary | ICD-10-CM | POA: Diagnosis present

## 2018-06-08 DIAGNOSIS — F17210 Nicotine dependence, cigarettes, uncomplicated: Secondary | ICD-10-CM | POA: Diagnosis present

## 2018-06-08 DIAGNOSIS — F419 Anxiety disorder, unspecified: Secondary | ICD-10-CM | POA: Diagnosis present

## 2018-06-08 HISTORY — DX: Pneumonia, unspecified organism: J18.9

## 2018-06-08 LAB — URINALYSIS, ROUTINE W REFLEX MICROSCOPIC
Bilirubin Urine: NEGATIVE
Glucose, UA: NEGATIVE mg/dL
Hgb urine dipstick: NEGATIVE
Ketones, ur: NEGATIVE mg/dL
Leukocytes,Ua: NEGATIVE
Nitrite: NEGATIVE
Protein, ur: NEGATIVE mg/dL
Specific Gravity, Urine: 1.02 (ref 1.005–1.030)
pH: 6 (ref 5.0–8.0)

## 2018-06-08 LAB — PROTIME-INR
INR: 1 (ref 0.8–1.2)
Prothrombin Time: 12.6 seconds (ref 11.4–15.2)

## 2018-06-08 LAB — SARS CORONAVIRUS 2 AG (30 MIN TAT): SARS Coronavirus 2 Ag: NEGATIVE

## 2018-06-08 LAB — COMPREHENSIVE METABOLIC PANEL
ALT: 28 U/L (ref 0–44)
AST: 13 U/L — ABNORMAL LOW (ref 15–41)
Albumin: 3.5 g/dL (ref 3.5–5.0)
Alkaline Phosphatase: 82 U/L (ref 38–126)
Anion gap: 8 (ref 5–15)
BUN: 19 mg/dL (ref 6–20)
CO2: 28 mmol/L (ref 22–32)
Calcium: 9 mg/dL (ref 8.9–10.3)
Chloride: 100 mmol/L (ref 98–111)
Creatinine, Ser: 0.93 mg/dL (ref 0.61–1.24)
GFR calc Af Amer: >60 mL/min (ref >60)
GFR calc non Af Amer: >60 mL/min (ref >60)
Glucose, Bld: 105 mg/dL — ABNORMAL HIGH (ref 70–99)
Potassium: 4 mmol/L (ref 3.5–5.1)
Sodium: 136 mmol/L (ref 135–145)
Total Bilirubin: 0.5 mg/dL (ref 0.3–1.2)
Total Protein: 6.8 g/dL (ref 6.5–8.1)

## 2018-06-08 LAB — CBC WITH DIFFERENTIAL/PLATELET
Abs Immature Granulocytes: 0.04 10*3/uL (ref 0.00–0.07)
Basophils Absolute: 0.1 10*3/uL (ref 0.0–0.1)
Basophils Relative: 1 %
Eosinophils Absolute: 0.1 10*3/uL (ref 0.0–0.5)
Eosinophils Relative: 1 %
HCT: 39.2 % (ref 39.0–52.0)
Hemoglobin: 12.2 g/dL — ABNORMAL LOW (ref 13.0–17.0)
Immature Granulocytes: 1 %
Lymphocytes Relative: 25 %
Lymphs Abs: 2.1 10*3/uL (ref 0.7–4.0)
MCH: 28.8 pg (ref 26.0–34.0)
MCHC: 31.1 g/dL (ref 30.0–36.0)
MCV: 92.5 fL (ref 80.0–100.0)
Monocytes Absolute: 0.6 10*3/uL (ref 0.1–1.0)
Monocytes Relative: 7 %
Neutro Abs: 5.6 10*3/uL (ref 1.7–7.7)
Neutrophils Relative %: 65 %
Platelets: 189 10*3/uL (ref 150–400)
RBC: 4.24 MIL/uL (ref 4.22–5.81)
RDW: 13.1 % (ref 11.5–15.5)
WBC: 8.5 10*3/uL (ref 4.0–10.5)
nRBC: 0 % (ref 0.0–0.2)

## 2018-06-08 MED ORDER — IOHEXOL 300 MG/ML  SOLN
100.0000 mL | Freq: Once | INTRAMUSCULAR | Status: AC | PRN
  Administered 2018-06-08: 80 mL via INTRAVENOUS

## 2018-06-08 MED ORDER — AZITHROMYCIN 500 MG IV SOLR
INTRAVENOUS | Status: AC
  Filled 2018-06-08: qty 500

## 2018-06-08 MED ORDER — SODIUM CHLORIDE 0.9 % IV SOLN
1.0000 g | Freq: Once | INTRAVENOUS | Status: AC
  Administered 2018-06-08: 1 g via INTRAVENOUS
  Filled 2018-06-08: qty 10

## 2018-06-08 MED ORDER — SODIUM CHLORIDE 0.9 % IV SOLN
500.0000 mg | Freq: Once | INTRAVENOUS | Status: AC
  Administered 2018-06-08: 500 mg via INTRAVENOUS
  Filled 2018-06-08: qty 500

## 2018-06-08 MED ORDER — SODIUM CHLORIDE 0.9 % IV SOLN
Freq: Once | INTRAVENOUS | Status: AC
Start: 2018-06-08 — End: 2018-06-09
  Administered 2018-06-08: 22:00:00 via INTRAVENOUS
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
## 2018-06-08 NOTE — ED Notes (Signed)
Patient transported to CT 

## 2018-06-08 NOTE — ED Notes (Signed)
ED Provider at bedside. 
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
## 2018-06-08 NOTE — ED Notes (Signed)
Pt placed on 2L oxygen - nasal cannula

## 2018-06-08 NOTE — ED Triage Notes (Signed)
Pt c/o coughing up blood x 7 days also c/o weight loss

## 2018-06-08 NOTE — ED Notes (Signed)
Patient is attempting to give urine sample.

## 2018-06-08 NOTE — ED Notes (Signed)
Called floor to give report- floor unsure if accepting pt.

## 2018-06-09 ENCOUNTER — Other Ambulatory Visit: Payer: Self-pay

## 2018-06-09 ENCOUNTER — Encounter (HOSPITAL_COMMUNITY): Payer: Self-pay | Admitting: Family Medicine

## 2018-06-09 DIAGNOSIS — F17210 Nicotine dependence, cigarettes, uncomplicated: Secondary | ICD-10-CM

## 2018-06-09 DIAGNOSIS — J189 Pneumonia, unspecified organism: Secondary | ICD-10-CM

## 2018-06-09 DIAGNOSIS — R1310 Dysphagia, unspecified: Secondary | ICD-10-CM

## 2018-06-09 DIAGNOSIS — R042 Hemoptysis: Secondary | ICD-10-CM

## 2018-06-09 DIAGNOSIS — R634 Abnormal weight loss: Secondary | ICD-10-CM

## 2018-06-09 LAB — CBC WITH DIFFERENTIAL/PLATELET
Abs Immature Granulocytes: 0.03 10*3/uL (ref 0.00–0.07)
Basophils Absolute: 0 10*3/uL (ref 0.0–0.1)
Basophils Relative: 1 %
Eosinophils Absolute: 0.1 10*3/uL (ref 0.0–0.5)
Eosinophils Relative: 1 %
HCT: 44.5 % (ref 39.0–52.0)
Hemoglobin: 14.3 g/dL (ref 13.0–17.0)
Immature Granulocytes: 0 %
Lymphocytes Relative: 15 %
Lymphs Abs: 1.3 10*3/uL (ref 0.7–4.0)
MCH: 29.5 pg (ref 26.0–34.0)
MCHC: 32.1 g/dL (ref 30.0–36.0)
MCV: 91.9 fL (ref 80.0–100.0)
Monocytes Absolute: 0.4 10*3/uL (ref 0.1–1.0)
Monocytes Relative: 5 %
Neutro Abs: 6.8 10*3/uL (ref 1.7–7.7)
Neutrophils Relative %: 78 %
Platelets: 199 10*3/uL (ref 150–400)
RBC: 4.84 MIL/uL (ref 4.22–5.81)
RDW: 12.9 % (ref 11.5–15.5)
WBC: 8.6 10*3/uL (ref 4.0–10.5)
nRBC: 0 % (ref 0.0–0.2)

## 2018-06-09 LAB — BASIC METABOLIC PANEL
Anion gap: 9 (ref 5–15)
BUN: 16 mg/dL (ref 6–20)
CO2: 26 mmol/L (ref 22–32)
Calcium: 8.9 mg/dL (ref 8.9–10.3)
Chloride: 100 mmol/L (ref 98–111)
Creatinine, Ser: 0.81 mg/dL (ref 0.61–1.24)
GFR calc Af Amer: >60 mL/min (ref >60)
GFR calc non Af Amer: >60 mL/min (ref >60)
Glucose, Bld: 102 mg/dL — ABNORMAL HIGH (ref 70–99)
Potassium: 4.2 mmol/L (ref 3.5–5.1)
Sodium: 135 mmol/L (ref 135–145)

## 2018-06-09 LAB — STREP PNEUMONIAE URINARY ANTIGEN: Strep Pneumo Urinary Antigen: NEGATIVE

## 2018-06-09 MED ORDER — LORAZEPAM 0.5 MG PO TABS
0.5000 mg | ORAL_TABLET | Freq: Three times a day (TID) | ORAL | Status: DC | PRN
  Administered 2018-06-09 – 2018-06-11 (×4): 0.5 mg via ORAL
  Filled 2018-06-09 (×4): qty 1

## 2018-06-09 MED ORDER — ONDANSETRON HCL 4 MG PO TABS: 4.0000 mg | ORAL_TABLET | Freq: Four times a day (QID) | ORAL | Status: DC | PRN

## 2018-06-09 MED ORDER — ACETAMINOPHEN 650 MG RE SUPP: 650.0000 mg | Freq: Four times a day (QID) | RECTAL | Status: DC | PRN

## 2018-06-09 MED ORDER — NICOTINE 14 MG/24HR TD PT24
14.0000 mg | MEDICATED_PATCH | Freq: Every day | TRANSDERMAL | Status: DC
  Administered 2018-06-09 – 2018-06-11 (×3): 14 mg via TRANSDERMAL
  Filled 2018-06-09 (×4): qty 1

## 2018-06-09 MED ORDER — ACETAMINOPHEN 325 MG PO TABS: 650.0000 mg | ORAL_TABLET | Freq: Four times a day (QID) | ORAL | Status: DC | PRN

## 2018-06-09 MED ORDER — SODIUM CHLORIDE 0.9 % IV SOLN
500.0000 mg | INTRAVENOUS | Status: DC
  Administered 2018-06-09 – 2018-06-10 (×2): 500 mg via INTRAVENOUS
  Filled 2018-06-09 (×2): qty 500

## 2018-06-09 MED ORDER — SODIUM CHLORIDE 0.9 % IV SOLN
1.0000 g | INTRAVENOUS | Status: DC
  Administered 2018-06-09 – 2018-06-10 (×2): 1 g via INTRAVENOUS
  Filled 2018-06-09 (×2): qty 1
  Filled 2018-06-09: qty 10

## 2018-06-09 MED ORDER — HYDROCODONE-ACETAMINOPHEN 5-325 MG PO TABS
1.0000 | ORAL_TABLET | ORAL | Status: DC | PRN
  Administered 2018-06-09: 1 via ORAL
  Administered 2018-06-09 – 2018-06-10 (×3): 2 via ORAL
  Administered 2018-06-10: 1 via ORAL
  Administered 2018-06-10 – 2018-06-12 (×8): 2 via ORAL
  Filled 2018-06-09 (×2): qty 2
  Filled 2018-06-09: qty 1
  Filled 2018-06-09 (×3): qty 2
  Filled 2018-06-09: qty 1
  Filled 2018-06-09: qty 2
  Filled 2018-06-09 (×2): qty 1
  Filled 2018-06-09 (×5): qty 2

## 2018-06-09 MED ORDER — ONDANSETRON HCL 4 MG/2ML IJ SOLN: 4.0000 mg | Freq: Four times a day (QID) | INTRAMUSCULAR | Status: DC | PRN

## 2018-06-09 MED ORDER — SODIUM CHLORIDE 0.9 % IV SOLN
250.0000 mL | INTRAVENOUS | Status: DC | PRN
  Administered 2018-06-09 – 2018-06-10 (×2): 250 mL via INTRAVENOUS

## 2018-06-09 MED ORDER — SENNOSIDES-DOCUSATE SODIUM 8.6-50 MG PO TABS: 1.0000 | ORAL_TABLET | Freq: Every evening | ORAL | Status: DC | PRN

## 2018-06-09 MED ORDER — SODIUM CHLORIDE 0.9% FLUSH
3.0000 mL | Freq: Two times a day (BID) | INTRAVENOUS | Status: DC
  Administered 2018-06-09 – 2018-06-12 (×8): 3 mL via INTRAVENOUS

## 2018-06-09 MED ORDER — SODIUM CHLORIDE 0.9% FLUSH: 3.0000 mL | INTRAVENOUS | Status: DC | PRN

## 2018-06-09 MED ORDER — BISACODYL 5 MG PO TBEC: 5.0000 mg | DELAYED_RELEASE_TABLET | Freq: Every day | ORAL | Status: DC | PRN

## 2018-06-09 NOTE — Progress Notes (Signed)
Patient seen and examined personally, I reviewed the chart, history and physical and admission note, done by admitting physician this morning and agree with the same with following addendum.  Please refer to the morning admission note for more detailed plan of care.  Briefly,  53YOM w no pmh admitted with c/o Hemoptysis last on Thursday,which started 1 wk before that,some cough, sob, wt loss of 10 lb since then. No cp, fever chills, n/vabd pain. smokes 1/2 ppd x30 yo. Denies sick contact. Feels fine this am, no cough or sob, fever, chills. On exam, lungs clear, not in distress.  Sputum for AFB ordered but not having sputum now. quantiferon gold pending. Will continue on CAP treatment with antibiotics. Discussed w/ Dr Drue Second.  CT Chest w contrast:"Scattered nodular and ground-glass opacities involving the lower lobes as well as right upper lobe most consistent with an infectious Process" CT neck w contrast: "Slight asymmetric prominence of the right palatine tonsil as compared to the left without discrete mass, of uncertain significance. Correlation with direct visualization recommended. Single mildly enlarged 12 mm left level IB lymph node, indeterminate, but could be reactive. Clinical follow-up to resolution recommended. No other discrete adenopathy or mass within the neck."

## 2018-06-09 NOTE — Consult Note (Signed)
[facility]  Total days of antibiotics 2       Reason for Consult: cavitary pneumonia with dysphagia    Referring Physician: Maren Beach  Principal Problem:   Multifocal pneumonia Active Problems:   Dysphagia   Hemoptysis   Weight loss    HPI: Darrell Matthews is a 53 y.o. male with history of intermittent shortness of breath, unintentional weight loss of #10 < 34month and recently having hemoptysis in the last week. He also reports having dysphagia in the last month. On admit, he is afebrile, no leukocytosis, but chest ct showing multifocal pneumonia with some cavitary lesions. His blood culture are no growth to date. Has hx of smoking 1/2 ppd  Past Medical History:  Diagnosis Date   Anxiety    Bronchitis    Kidney stones    Prostate enlargement     Allergies: No Known Allergies   MEDICATIONS:Current antibiotics:    nicotine  14 mg Transdermal Daily   sodium chloride flush  3 mL Intravenous Q12H    Social History   Tobacco Use   Smoking status: Current Every Day Smoker    Packs/day: 1.00    Types: Cigarettes   Smokeless tobacco: Never Used  Substance Use Topics   Alcohol use: No    Comment: weekends   Drug use: No    History reviewed. No pertinent family history.  Review of Systems  Constitutional: +weight loss, and nightsweat.s  Negative for fever, chills, diaphoresis, activity change, appetite change, fatigue  HENT: Negative for congestion, sore throat, rhinorrhea, sneezing, trouble swallowing and sinus pressure.  Eyes: Negative for photophobia and visual disturbance.  Respiratory: positive for hemoptysis for cough, chest tightness, shortness of breath, wheezing and stridor.  Cardiovascular: Negative for chest pain, palpitations and leg swelling.  Gastrointestinal: +dysphagia. Negative for nausea, vomiting, abdominal pain, diarrhea, constipation, blood in stool, abdominal distention and anal bleeding.  Genitourinary:  Negative for dysuria, hematuria, flank pain and difficulty urinating.  Musculoskeletal: Negative for myalgias, back pain, joint swelling, arthralgias and gait problem.  Skin: Negative for color change, pallor, rash and wound.  Neurological: Negative for dizziness, tremors, weakness and light-headedness.  Hematological: Negative for adenopathy. Does not bruise/bleed easily.  Psychiatric/Behavioral: Negative for behavioral problems, confusion, sleep disturbance, dysphoric mood, decreased concentration and agitation.     OBJECTIVE: Temp:  [97.5 F (36.4 C)-98.5 F (36.9 C)] 98.2 F (36.8 C) (05/09 1246) Pulse Rate:  [57-81] 81 (05/09 1246) Resp:  [10-18] 18 (05/09 1246) BP: (101-145)/(45-84) 101/45 (05/09 1246) SpO2:  [95 %-98 %] 97 % (05/09 1246) Weight:  [65.8 kg-67.1 kg] 65.8 kg (05/09 0130) Physical Exam  Constitutional: He is oriented to person, place, and time. He appears well-developed and well-nourished. No distress.  HENT:  Mouth/Throat: Oropharynx is clear and moist. No oropharyngeal exudate.  Cardiovascular: Normal rate, regular rhythm and normal heart sounds. Exam reveals no gallop and no friction rub.  No murmur heard.  Pulmonary/Chest: Effort normal and breath sounds normal. No respiratory distress. He has no wheezes.  Abdominal: Soft. Bowel sounds are normal. He exhibits no distension. There is no tenderness.  Lymphadenopathy:  He has no cervical adenopathy.  Neurological: He is alert and oriented to person, place, and time.  Skin: Skin is warm and dry. No rash noted. No erythema.  Psychiatric: He has a normal mood and affect. His behavior is normal.     LABS: Results for orders placed or performed during the hospital encounter of 06/08/18 (from the past  48 hour(s))  Comprehensive metabolic panel     Status: Abnormal   Collection Time: 06/08/18  5:42 PM  Result Value Ref Range   Sodium 136 135 - 145 mmol/L   Potassium 4.0 3.5 - 5.1 mmol/L   Chloride 100 98 - 111  mmol/L   CO2 28 22 - 32 mmol/L   Glucose, Bld 105 (H) 70 - 99 mg/dL   BUN 19 6 - 20 mg/dL   Creatinine, Ser 0.93 0.61 - 1.24 mg/dL   Calcium 9.0 8.9 - 10.3 mg/dL   Total Protein 6.8 6.5 - 8.1 g/dL   Albumin 3.5 3.5 - 5.0 g/dL   AST 13 (L) 15 - 41 U/L   ALT 28 0 - 44 U/L   Alkaline Phosphatase 82 38 - 126 U/L   Total Bilirubin 0.5 0.3 - 1.2 mg/dL   GFR calc non Af Amer >60 >60 mL/min   GFR calc Af Amer >60 >60 mL/min   Anion gap 8 5 - 15    Comment: Performed at [facility], [address] [place], [place] [address]  CBC with Differential     Status: Abnormal   Collection Time: 06/08/18  5:42 PM  Result Value Ref Range   WBC 8.5 4.0 - 10.5 K/uL   RBC 4.24 4.22 - 5.81 MIL/uL   Hemoglobin 12.2 (L) 13.0 - 17.0 g/dL   HCT 39.2 39.0 - 52.0 %   MCV 92.5 80.0 - 100.0 fL   MCH 28.8 26.0 - 34.0 pg   MCHC 31.1 30.0 - 36.0 g/dL   RDW 13.1 11.5 - 15.5 %   Platelets 189 150 - 400 K/uL   nRBC 0.0 0.0 - 0.2 %   Neutrophils Relative % 65 %   Neutro Abs 5.6 1.7 - 7.7 K/uL   Lymphocytes Relative 25 %   Lymphs Abs 2.1 0.7 - 4.0 K/uL   Monocytes Relative 7 %   Monocytes Absolute 0.6 0.1 - 1.0 K/uL   Eosinophils Relative 1 %   Eosinophils Absolute 0.1 0.0 - 0.5 K/uL   Basophils Relative 1 %   Basophils Absolute 0.1 0.0 - 0.1 K/uL   Immature Granulocytes 1 %   Abs Immature Granulocytes 0.04 0.00 - 0.07 K/uL    Comment: Performed at [facility], [address] [place], [place] [address]  Protime-INR     Status: None   Collection Time: 06/08/18  5:42 PM  Result Value Ref Range   Prothrombin Time 12.6 11.4 - 15.2 seconds   INR 1.0 0.8 - 1.2    Comment: (NOTE) INR goal varies based on device and disease states. Performed at [facility], [address] [place], [place] [address]   Urinalysis, Routine w reflex microscopic     Status: None   Collection Time: 06/08/18  8:51 PM  Result Value Ref Range   Color, Urine YELLOW YELLOW   APPearance CLEAR CLEAR    Specific Gravity, Urine 1.020 1.005 - 1.030   pH 6.0 5.0 - 8.0   Glucose, UA NEGATIVE NEGATIVE mg/dL   Hgb urine dipstick NEGATIVE NEGATIVE   Bilirubin Urine NEGATIVE NEGATIVE   Ketones, ur NEGATIVE NEGATIVE mg/dL   Protein, ur NEGATIVE NEGATIVE mg/dL   Nitrite NEGATIVE NEGATIVE   Leukocytes,Ua NEGATIVE NEGATIVE    Comment: Microscopic not done on urines with negative protein, blood, leukocytes, nitrite, or glucose < 500 mg/dL. Performed at [facility], [address] [place], [place]  [address]   SARS Coronavirus 2 (Hosp order,Performed in [facility] lab via [employer])     Status: None   Collection Time: 06/08/18  9:41 PM  Result Value Ref Range   SARS Coronavirus 2 ([employer] Now) NEGATIVE NEGATIVE    Comment: (NOTE) Interpretive Result Comment(s): COVID 19 Positive SARS CoV 2 target nucleic acids are DETECTED. The SARS CoV 2 RNA is generally detectable in upper and lower respiratory specimens during the acute phase of infection.  Positive results are indicative of active infection with SARS CoV 2.  Clinical correlation with patient history and other diagnostic information is necessary to determine patient infection status.  Positive results do not rule out bacterial infection or coinfection with other viruses. The expected result is Negative. COVID 19 Negative SARS CoV 2 target nucleic acids are NOT DETECTED. The SARS CoV 2 RNA is generally detectable in upper and lower respiratory specimens during the acute phase of infection.  Negative results do not preclude SARS CoV 2 infection, do not rule out coinfections with other pathogens, and should not be used as the sole basis for treatment or other patient management decisions.  Negative results must be combined with clinical  observations, patient history, and epidemiological information. The expected result is Negative. Invalid Presence or absence of SARS CoV 2 nucleic acids cannot be determined. Repeat  testing was performed on the submitted specimen and repeated Invalid results were obtained.  If clinically indicated, additional testing on a new specimen with an alternate test methodology [phone]) is advised.  The SARS CoV 2 RNA is generally detectable in upper and lower respiratory specimens during the acute phase of infection. The expected result is Negative. Fact Sheet for Patients:  GolfingFamily.no Fact Sheet for Healthcare Providers: https://www.hernandez-brewer.com/ This test is not yet approved or cleared by the [place] FDA and has been authorized for detection and/or diagnosis of SARS CoV 2 by FDA under an Emergency Use Authorization (EUA).  This EUA will remain in effect (meaning this test can be used) for the duration of the COVID19 d eclaration under Section 564(b)(1) of the Act, 21 U.S.C. section 218 070 0601 3(b)(1), unless the authorization is terminated or revoked sooner. Performed at [facility], [address] [place], [place] [address]   Strep pneumoniae urinary antigen     Status: None   Collection Time: 06/09/18  1:32 AM  Result Value Ref Range   Strep Pneumo Urinary Antigen NEGATIVE NEGATIVE    Comment:        Infection due to S. pneumoniae cannot be absolutely ruled out since the antigen present may be below the detection limit of the test. Performed at [facility], [address], [place], [place] [address]   CBC WITH DIFFERENTIAL     Status: None   Collection Time: 06/09/18  8:37 AM  Result Value Ref Range   WBC 8.6 4.0 - 10.5 K/uL   RBC 4.84 4.22 - 5.81 MIL/uL   Hemoglobin 14.3 13.0 - 17.0 g/dL   HCT 44.5 39.0 - 52.0 %   MCV 91.9 80.0 - 100.0 fL   MCH 29.5 26.0 - 34.0 pg   MCHC 32.1 30.0 - 36.0 g/dL   RDW 12.9 11.5 - 15.5 %   Platelets 199 150 - 400 K/uL   nRBC 0.0 0.0 - 0.2 %   Neutrophils Relative % 78 %   Neutro Abs 6.8 1.7 - 7.7 K/uL   Lymphocytes Relative 15 %   Lymphs Abs 1.3 0.7 -  4.0 K/uL   Monocytes Relative 5 %   Monocytes Absolute 0.4 0.1 - 1.0 K/uL   Eosinophils Relative 1 %   Eosinophils Absolute 0.1 0.0 - 0.5 K/uL   Basophils Relative 1 %   Basophils Absolute 0.0 0.0 - 0.1 K/uL   Immature Granulocytes 0 %   Abs Immature Granulocytes 0.03 0.00 - 0.07 K/uL    Comment: Performed at [facility], [address], [place], [place] [address]    MICRO: Blood cx pending IMAGING: Ct Soft Tissue Neck W Contrast  Result Date: 06/08/2018 CLINICAL DATA:  Initial evaluation for palpable nodule or thyroid enlargement. EXAM: CT NECK WITH CONTRAST TECHNIQUE: Multidetector CT imaging of the neck was performed using the standard protocol following the bolus administration of intravenous contrast. CONTRAST:  70m OMNIPAQUE IOHEXOL 300 MG/ML  SOLN COMPARISON:  None. FINDINGS: Pharynx and larynx: Oral cavity within normal limits. No acute abnormality about the dentition. Slight asymmetric prominence of the right palatine tonsil as compared to the left without discrete mass, of uncertain significance. Small calcified tonsillith noted on the right. No tonsillar or peritonsillar collection. Parapharyngeal fat maintained. Nasopharynx within normal limits. No retropharyngeal collection. Epiglottis normal. Vallecula clear. Remainder of the hypopharynx and supraglottic larynx within normal limits. True cords symmetric and normal. Subglottic airway clear. Salivary glands: Salivary glands including the parotid and submandibular glands are normal. Thyroid: Thyroid normal. No discrete thyroid nodule or mass. Lymph nodes: Single mildly enlarged left level IB node measures 12 mm in short axis, indeterminate. Additional shotty subcentimeter lymph nodes noted within the neck bilaterally. No pathologically enlarged lymph nodes identified within the neck. Few mildly prominent paratracheal lymph nodes measure up to 13 mm, indeterminate, but could be reactive. Vascular: Normal intravascular  enhancement seen throughout the neck. Limited intracranial: Unremarkable. Visualized orbits: Unremarkable. Mastoids and visualized paranasal sinuses: Paranasal sinuses are clear. Mastoid air cells and middle ear cavities are well pneumatized and free of fluid. Skeleton: No acute osseous abnormality. No discrete lytic or blastic osseous lesions. Prior ACDF at C5-C7 with solid arthrodesis. Patient is status post posterior instrumentation at C5-C7 as well. Upper chest: Patchy ground-glass opacities within the visualized right upper and left lower lobes, better evaluated on concomitant chest CT. Other: None. IMPRESSION: 1. Slight asymmetric prominence of the right palatine tonsil as compared to the left without discrete mass, of uncertain significance. Correlation with direct visualization recommended. 2. Single mildly enlarged 12 mm left level IB lymph node, indeterminate, but could be reactive. Clinical follow-up to resolution recommended. No other discrete adenopathy or mass within the neck. 3. Patchy ground-glass opacities within the visualized lungs, suspicious for possible infectious pneumonitis. Mildly enlarged mediastinal adenopathy suspected to be reactive. Findings better evaluated on concomitant chest CT. Electronically Signed   By: BJeannine BogaM.D.   On: 06/08/2018 20:27   Ct Chest W Contrast  Result Date: 06/08/2018 CLINICAL DATA:  53year old male with hemoptysis and weight loss. Chest tightness. Patient feels like food gets stuck in his throat. EXAM: CT CHEST WITH CONTRAST TECHNIQUE: Multidetector CT imaging of the chest was performed during intravenous contrast administration. CONTRAST:  812mOMNIPAQUE IOHEXOL 300 MG/ML  SOLN COMPARISON:  Chest CT dated 03/28/2016 FINDINGS: Cardiovascular: There is no cardiomegaly or pericardial effusion. There is atherosclerotic calcification of the aortic valve. The thoracic aorta appears unremarkable. The origins of the great vessels of the aortic arch are  patent. The central pulmonary arteries appear unremarkable for the degree of opacification. Evaluation of the pulmonary arteries is limited  due to suboptimal opacification and timing of the contrast. Mediastinum/Nodes: Top-normal right hilar lymph node measures 11 mm in short axis. A relatively stable mildly enlarged lymph node anterior to the trachea (series 2, image 23) measures 13 mm. The esophagus is grossly unremarkable. Lungs/Pleura: Scattered nodular and ground-glass opacities involving the lower lobes bilaterally as well as right upper lobe most consistent with an infectious process. Some of these nodules appear to have central cavitation. Differential diagnosis includes fungal or other atypical infections, TB, or less likely abscesses. Other etiologies are not excluded. Clinical correlation is recommended. There is no pleural effusion or pneumothorax. The central airways are patent. Upper Abdomen: No acute abnormality. Musculoskeletal: No chest wall abnormality. No acute or significant osseous findings. IMPRESSION: Scattered nodular and ground-glass opacities involving the lower lobes as well as right upper lobe most consistent with an infectious process. Clinical correlation and follow-up recommended. Electronically Signed   By: Anner Crete M.D.   On: 06/08/2018 19:50    Assessment/Plan:  53yo M with multifocal pneumonia with areas of cavitary nodules, unintentional weight loss and hemoptysis but also has had weeks of dysphagia. RF includes smoking hx. 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   Dysphagia = recommend barium swallow study to see if there is abnormality. Pending those studies then would get GI to evaluate  Pulmonary process = could including mTB, NTM like MAC, mAbscessus. First would recommend to be 3 AFB sputum to rule out mTB. May need pulmonary evaluation if unsuccessful at collecting afb culture  Cavitary pneumonia = it could still be be bacterial such as with  endocarditis. Recommend repeat blood cx and TTE.

## 2018-06-09 NOTE — Progress Notes (Signed)
QuantiFERON TB Gold Plus test unable to be sent off until Monday, Jun 11, 2018 per lab.  Lab to be drawn and sent off on Jun 11, 2018.  Dr. Lanae Boast notified via text page.

## 2018-06-09 NOTE — Progress Notes (Addendum)
Patient reports "feeling anxious".  States he takes Ativan at home.  PRN Ativan to be given.  Also requesting Nicotine patch  Reports smoking 1/2 ppd at home.  Dr. Estelle Grumbles notified for patch.

## 2018-06-09 NOTE — ED Notes (Signed)
[facility] arrived to transport pt to [facility] 
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
## 2018-06-09 NOTE — ED Notes (Signed)
Floor RN made aware of acid fast smear not collected here due to send out.
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
## 2018-06-10 ENCOUNTER — Inpatient Hospital Stay (HOSPITAL_COMMUNITY): Payer: Self-pay

## 2018-06-10 DIAGNOSIS — R0602 Shortness of breath: Secondary | ICD-10-CM

## 2018-06-10 DIAGNOSIS — Z9289 Personal history of other medical treatment: Secondary | ICD-10-CM

## 2018-06-10 HISTORY — DX: Personal history of other medical treatment: Z92.89

## 2018-06-10 LAB — CBC
HCT: 43 % (ref 39.0–52.0)
Hemoglobin: 13.9 g/dL (ref 13.0–17.0)
MCH: 29.6 pg (ref 26.0–34.0)
MCHC: 32.3 g/dL (ref 30.0–36.0)
MCV: 91.7 fL (ref 80.0–100.0)
Platelets: 211 10*3/uL (ref 150–400)
RBC: 4.69 MIL/uL (ref 4.22–5.81)
RDW: 12.7 % (ref 11.5–15.5)
WBC: 12.1 10*3/uL — ABNORMAL HIGH (ref 4.0–10.5)
nRBC: 0 % (ref 0.0–0.2)

## 2018-06-10 LAB — BASIC METABOLIC PANEL
Anion gap: 10 (ref 5–15)
BUN: 18 mg/dL (ref 6–20)
CO2: 26 mmol/L (ref 22–32)
Calcium: 9 mg/dL (ref 8.9–10.3)
Chloride: 103 mmol/L (ref 98–111)
Creatinine, Ser: 0.8 mg/dL (ref 0.61–1.24)
GFR calc Af Amer: >60 mL/min (ref >60)
GFR calc non Af Amer: >60 mL/min (ref >60)
Glucose, Bld: 107 mg/dL — ABNORMAL HIGH (ref 70–99)
Potassium: 3.7 mmol/L (ref 3.5–5.1)
Sodium: 139 mmol/L (ref 135–145)

## 2018-06-10 LAB — HIV ANTIBODY (ROUTINE TESTING W REFLEX): HIV Screen 4th Generation wRfx: NONREACTIVE

## 2018-06-10 LAB — ECHOCARDIOGRAM COMPLETE
Height: 71 in
Weight: 2321 oz

## 2018-06-10 MED ORDER — PRO-STAT SUGAR FREE PO LIQD
30.0000 mL | Freq: Every day | ORAL | Status: DC
  Administered 2018-06-11: 30 mL via ORAL
  Filled 2018-06-10 (×2): qty 30

## 2018-06-10 MED ORDER — ENSURE ENLIVE PO LIQD
237.0000 mL | Freq: Two times a day (BID) | ORAL | Status: DC
  Administered 2018-06-10 – 2018-06-12 (×4): 237 mL via ORAL

## 2018-06-10 NOTE — Progress Notes (Signed)
Initial Nutrition Assessment  RD working remotely.   DOCUMENTATION CODES:   (unable to assess for malnutrition at this time)  INTERVENTION:  - will order Ensure Enlive BID, each supplement provides 350 kcal and 20 grams of protein. - will order 30 mL Prostat once/day, each supplement provides 100 kcal and 15 grams of protein. - continue to encourage PO intakes of well tolerated items.    NUTRITION DIAGNOSIS:   Increased nutrient needs related to acute illness as evidenced by estimated needs.  GOAL:   Patient will meet greater than or equal to 90% of their needs  MONITOR:   PO intake, Supplement acceptance, Labs, Weight trends, I & O's  REASON FOR ASSESSMENT:   Malnutrition Screening Tool  ASSESSMENT:   53 y.o. male with medical history significant for tobacco abuse. He presented to the ED for evaluation of difficulty swallowing, hemoptysis, SOB, and unintentional weight loss. He reported that for the past 1 month food seems to get stuck when he tries to swallow. He developed productive cough and mild dyspnea ~1 week ago and reported that sputum is bloody.  BMI indicates normal weight. Per RN flow sheet, patient consumed 25% of breakfast, 50% of lunch, and 100% of dinner yesterday. Patient was seen by SLP yesterday and note from that encounter reviewed in detail. TB testing unable to be done until tomorrow (5/11). If testing is negative, SLP recommends MBS be done.   Patient reports that difficulty swallowing began ~1 month ago and includes solid foods and pills. He is usually able to manage ok by knowing which foods he will better tolerate, taking smaller bites, and using liquids to wash things down.  He denies change in appetite but has been eating less d/t difficulties swallowing.  Per chart review, current weight is 145 lb. The next most recent weight in the chart was from [facility] on 10/17/17 when he weighed 164 lb. This indicates 19 lb weight loss (11.6% body weight) in the  past 8 months; not significant for time frame. Patient feels that he has lost 10 lb in the past 1 month which would indicate 6% body weight in that time frame; significant.    Medications reviewed. Labs reviewed.      NUTRITION - FOCUSED PHYSICAL EXAM:  unable to perform at this time.   Diet Order:   Diet Order            Diet Heart Room service appropriate? Yes; Fluid consistency: Thin  Diet effective now              EDUCATION NEEDS:   No education needs have been identified at this time  Skin:  Skin Assessment: Reviewed RN Assessment  Last BM:  5/9  Height:   Ht Readings from Last 1 Encounters:  06/09/18 5\' 11"  (1.803 m)    Weight:   Wt Readings from Last 1 Encounters:  06/09/18 65.8 kg    Ideal Body Weight:  78.2 kg  BMI:  Body mass index is 20.23 kg/m.  Estimated Nutritional Needs:   Kcal:  4825-0037 kcal  Protein:  100-118 grams  Fluid:  >/= 2.1 L/day     Trenton Gammon, MS, RD, LDN, [facility] Inpatient Clinical Dietitian Pager # [phone] After hours/weekend pager # [phone]

## 2018-06-10 NOTE — Progress Notes (Signed)
PROGRESS NOTE    Darrell Matthews [facility]  ZOX:[id number] DOB: July 17, 1965 DOA: 06/08/2018 PCP: Patient, No Pcp Per   Brief Narrative: 53YOM w no pmh admitted with c/o Hemoptysis last on Thursday,which started 1 wk before that,some cough, sob, wt loss of 10 lb since then. No cp, fever chills, n/vabd pain. smokes 1/2 ppd x30 yo. Denies sick contact. In ER, CT Chest w contrast:"Scattered nodular and ground-glass opacities involving the lower lobes as well as right upper lobe most consistent with an infectious Process" CT neck w contrast: "Slight asymmetric prominence of the right palatine tonsil as compared to the left without discrete mass, of uncertain significance. Correlation with direct visualization recommended. Single mildly enlarged 12 mm left level IB lymph node, indeterminate, but could be reactive. Clinical follow-up to resolution recommended. No other discrete adenopathy or mass within the neck." Sputum for AFB ordered, started on antibiotics and admitted.  Subjective: Feels finem feels ready to go home.  Denies nausea vomiting fever chills.  Assessment & Plan:   Multifocal pneumonia: With areas of cavitary nodules, unintentional weight loss along with weeks of dysphagia.  Appreciate ID input.3 AFB sputum ordered to rule out MTB but patient not able to have any cough or sputum production.  We will need to consult pulmonary if unable to collect sample.  Remains on airborne precaution.  Continue on empiric ceftriaxone and azithromycin.  Given cavitary pneumonia consideration is also for  bacterial endocarditis so echo is ordered, blood culture pending so far negative. COVID 19 is neg. histoplasma antigen in process, QuantiFERON is pending to be sent on Monday  Dysphagia: Speech on board, barium swallow study ordered but patient being negative procedure and unable to perform.  CT neck with asymmetric prominence of the right palatine tonsil without discrete mass  Tobacco abuse, cessation  advised.  History of drug abuse.  Patient reports taking drugs in the past Percocet and heroin but denies IV drug abuse.    Urine drug screen ordered   DVT prophylaxis: SCD Code Status: full Family Communication: POC discussed with the patient and verbalized understanding.  Advised not to leave against  medical advice before completing the treatment. Disposition Plan: remains inpatient pending clinical improvement.   Consultants:  ID  Procedures: None  Antimicrobials: Anti-infectives (From admission, onward)   Start     Dose/Rate Route Frequency Ordered Stop   06/09/18 2200  cefTRIAXone (ROCEPHIN) 1 g in sodium chloride 0.9 % 100 mL IVPB     1 g 200 mL/hr over 30 Minutes Intravenous Every 24 hours 06/09/18 0134 06/15/18 2159   06/09/18 2200  azithromycin (ZITHROMAX) 500 mg in sodium chloride 0.9 % 250 mL IVPB     500 mg 250 mL/hr over 60 Minutes Intravenous Every 24 hours 06/09/18 0134 06/15/18 2159   06/08/18 2148  azithromycin (ZITHROMAX) 500 MG injection    Note to Pharmacy:  Estrella Deeds   : cabinet override      06/08/18 2148 06/09/18 0959   06/08/18 2130  cefTRIAXone (ROCEPHIN) 1 g in sodium chloride 0.9 % 100 mL IVPB     1 g 200 mL/hr over 30 Minutes Intravenous  Once 06/08/18 2126 06/08/18 2302   06/08/18 2130  azithromycin (ZITHROMAX) 500 mg in sodium chloride 0.9 % 250 mL IVPB     500 mg 250 mL/hr over 60 Minutes Intravenous  Once 06/08/18 2126 06/08/18 2302       Objective: Vitals:   06/09/18 1246 06/09/18 2050 06/10/18 0603 06/10/18 0859  BP: (!) 101/45  120/81 129/74 124/73  Pulse: 81 67 65 84  Resp: Temp: 98.2 F (36.8 C) 98 F (36.7 C) 98.2 F (36.8 C) 98.6 F (37 C)  TempSrc: Oral Oral Oral Oral  SpO2: 97% 99% 99% 98%  Weight:      Height:        Intake/Output Summary (Last 24 hours) at 06/10/2018 1344 Last data filed at 06/10/2018 0904 Gross per 24 hour  Intake 1235.31 ml  Output 1350 ml  Net -114.69 ml   Filed Weights    06/08/18 1726 06/09/18 0130  Weight: 67.1 kg 65.8 kg   Weight change:   Body mass index is 20.23 kg/m.  Intake/Output from previous day: 05/09 0701 - 05/10 0700 In: 1472.3 [P.O.:1080; I.V.:47.2; IV Piggyback:345.1] Out: 1600 [Urine:1600] Intake/Output this shift: Total I/O In: 3 [I.V.:3] Out: 350 [Urine:350]  Examination:  General exam: Appears calm and comfortable,Not in distress, older fore the age HEENT:PERRL,Oral mucosa moist, Ear/Nose normal on gross exam Respiratory system: Bilateral equal air entry, normal vesicular breath sounds, no wheezes or crackles  Cardiovascular system: S1 & S2 heard,No JVD, murmurs. Gastrointestinal system: Abdomen is  soft, non tender, non distended, BS +  Nervous System:Alert and oriented. No focal neurological deficits/moving extremities, sensation intact. Extremities: No edema, no clubbing, distal peripheral pulses palpable. Skin: No rashes, lesions, no icterus MSK: Normal muscle bulk,tone ,power  Medications:  Scheduled Meds:  nicotine  14 mg Transdermal Daily   sodium chloride flush  3 mL Intravenous Q12H   Continuous Infusions:  sodium chloride Stopped (06/10/18 0346)   azithromycin Stopped (06/09/18 2331)   cefTRIAXone (ROCEPHIN)  IV Stopped (06/09/18 2206)    Data Reviewed: I have personally reviewed following labs and imaging studies  CBC: Recent Labs  Lab 06/08/18 1742 06/09/18 0837 06/10/18 0412  WBC 8.5 8.6 12.1*  NEUTROABS 5.6 6.8  --   HGB 12.2* 14.3 13.9  HCT 39.2 44.5 43.0  MCV 92.5 91.9 91.7  PLT 189 199 211   Basic Metabolic Panel: Recent Labs  Lab 06/08/18 1742 06/10/18 0412  NA 136 139  K 4.0 3.7  CL 100 103  CO2 28 26  GLUCOSE 105* 107*  BUN 19 18  CREATININE 0.93 0.80  CALCIUM 9.0 9.0   GFR: Estimated Creatinine Clearance: 98.2 mL/min (by C-G formula based on SCr of 0.8 mg/dL). Liver Function Tests: Recent Labs  Lab 06/08/18 1742  AST 13*  ALT 28  ALKPHOS 82  BILITOT 0.5  PROT  6.8  ALBUMIN 3.5   No results for input(s): LIPASE, AMYLASE in the last 168 hours. No results for input(s): AMMONIA in the last 168 hours. Coagulation Profile: Recent Labs  Lab 06/08/18 1742  INR 1.0   Cardiac Enzymes: No results for input(s): CKTOTAL, CKMB, CKMBINDEX, TROPONINI in the last 168 hours. BNP (last 3 results) No results for input(s): PROBNP in the last 8760 hours. HbA1C: No results for input(s): HGBA1C in the last 72 hours. CBG: No results for input(s): GLUCAP in the last 168 hours. Lipid Profile: No results for input(s): CHOL, HDL, LDLCALC, TRIG, CHOLHDL, LDLDIRECT in the last 72 hours. Thyroid Function Tests: No results for input(s): TSH, T4TOTAL, FREET4, T3FREE, THYROIDAB in the last 72 hours. Anemia Panel: No results for input(s): VITAMINB12, FOLATE, FERRITIN, TIBC, IRON, RETICCTPCT in the last 72 hours. Sepsis Labs: No results for input(s): PROCALCITON, LATICACIDVEN in the last 168 hours.  Recent Results (from the past 240 hour(s))  Blood Culture (routine x 2)  Status: None (Preliminary result)   Collection Time: 06/08/18  9:30 PM  Result Value Ref Range Status   Specimen Description   Final    BLOOD RIGHT ARM Performed at [facility], [address] [place], [place] [address]    Special Requests   Final    BOTTLES DRAWN AEROBIC AND ANAEROBIC Blood Culture adequate volume Performed at [facility], [address] [place], [place] [address]    Culture   Final    NO GROWTH < 24 HOURS Performed at [facility], [address], [place], [place] [address]    Report Status PENDING  Incomplete  SARS Coronavirus 2 (Hosp order,Performed in [facility] lab via [employer] ID)     Status: None   Collection Time: 06/08/18  9:41 PM  Result Value Ref Range Status   SARS Coronavirus 2 ([employer] ID Now) NEGATIVE NEGATIVE Final    Comment: (NOTE) Interpretive Result Comment(s): COVID 19 Positive SARS CoV 2 target nucleic acids are  DETECTED. The SARS CoV 2 RNA is generally detectable in upper and lower respiratory specimens during the acute phase of infection.  Positive results are indicative of active infection with SARS CoV 2.  Clinical correlation with patient history and other diagnostic information is necessary to determine patient infection status.  Positive results do not rule out bacterial infection or coinfection with other viruses. The expected result is Negative. COVID 19 Negative SARS CoV 2 target nucleic acids are NOT DETECTED. The SARS CoV 2 RNA is generally detectable in upper and lower respiratory specimens during the acute phase of infection.  Negative results do not preclude SARS CoV 2 infection, do not rule out coinfections with other pathogens, and should not be used as the sole basis for treatment or other patient management decisions.  Negative results must be combined with clinical  observations, patient history, and epidemiological information. The expected result is Negative. Invalid Presence or absence of SARS CoV 2 nucleic acids cannot be determined. Repeat testing was performed on the submitted specimen and repeated Invalid results were obtained.  If clinically indicated, additional testing on a new specimen with an alternate test methodology [phone]) is advised.  The SARS CoV 2 RNA is generally detectable in upper and lower respiratory specimens during the acute phase of infection. The expected result is Negative. Fact Sheet for Patients:  http://www.graves-ford.org/ Fact Sheet for Healthcare Providers: EnviroConcern.si This test is not yet approved or cleared by the [place] FDA and has been authorized for detection and/or diagnosis of SARS CoV 2 by FDA under an Emergency Use Authorization (EUA).  This EUA will remain in effect (meaning this test can be used) for the duration of the COVID19 d eclaration under Section 564(b)(1) of the  Act, 21 U.S.C. section 707 109 7757 3(b)(1), unless the authorization is terminated or revoked sooner. Performed at [facility], [address] [place], [place] [address]   Blood Culture (routine x 2)     Status: None (Preliminary result)   Collection Time: 06/08/18  9:50 PM  Result Value Ref Range Status   Specimen Description   Final    BLOOD LEFT ARM Performed at [facility], [address] [place], [place] [address]    Special Requests   Final    BOTTLES DRAWN AEROBIC AND ANAEROBIC Blood Culture adequate volume Performed at [facility], [address], [place], [place] [address]    Culture  Final    NO GROWTH < 24 HOURS Performed at [facility], [address], [place], [place] [address]    Report Status PENDING  Incomplete      Radiology Studies: Ct Soft Tissue Neck W Contrast  Result Date: 06/08/2018 CLINICAL DATA:  Initial evaluation for palpable nodule or thyroid enlargement. EXAM: CT NECK WITH CONTRAST TECHNIQUE: Multidetector CT imaging of the neck was performed using the standard protocol following the bolus administration of intravenous contrast. CONTRAST:  80mL OMNIPAQUE IOHEXOL 300 MG/ML  SOLN COMPARISON:  None. FINDINGS: Pharynx and larynx: Oral cavity within normal limits. No acute abnormality about the dentition. Slight asymmetric prominence of the right palatine tonsil as compared to the left without discrete mass, of uncertain significance. Small calcified tonsillith noted on the right. No tonsillar or peritonsillar collection. Parapharyngeal fat maintained. Nasopharynx within normal limits. No retropharyngeal collection. Epiglottis normal. Vallecula clear. Remainder of the hypopharynx and supraglottic larynx within normal limits. True cords symmetric and normal. Subglottic airway clear. Salivary glands: Salivary glands including the parotid and submandibular glands are normal. Thyroid: Thyroid normal. No discrete thyroid nodule or  mass. Lymph nodes: Single mildly enlarged left level IB node measures 12 mm in short axis, indeterminate. Additional shotty subcentimeter lymph nodes noted within the neck bilaterally. No pathologically enlarged lymph nodes identified within the neck. Few mildly prominent paratracheal lymph nodes measure up to 13 mm, indeterminate, but could be reactive. Vascular: Normal intravascular enhancement seen throughout the neck. Limited intracranial: Unremarkable. Visualized orbits: Unremarkable. Mastoids and visualized paranasal sinuses: Paranasal sinuses are clear. Mastoid air cells and middle ear cavities are well pneumatized and free of fluid. Skeleton: No acute osseous abnormality. No discrete lytic or blastic osseous lesions. Prior ACDF at C5-C7 with solid arthrodesis. Patient is status post posterior instrumentation at C5-C7 as well. Upper chest: Patchy ground-glass opacities within the visualized right upper and left lower lobes, better evaluated on concomitant chest CT. Other: None. IMPRESSION: 1. Slight asymmetric prominence of the right palatine tonsil as compared to the left without discrete mass, of uncertain significance. Correlation with direct visualization recommended. 2. Single mildly enlarged 12 mm left level IB lymph node, indeterminate, but could be reactive. Clinical follow-up to resolution recommended. No other discrete adenopathy or mass within the neck. 3. Patchy ground-glass opacities within the visualized lungs, suspicious for possible infectious pneumonitis. Mildly enlarged mediastinal adenopathy suspected to be reactive. Findings better evaluated on concomitant chest CT. Electronically Signed   By: Rise Mu M.D.   On: 06/08/2018 20:27   Ct Chest W Contrast  Result Date: 06/08/2018 CLINICAL DATA:  53 year old male with hemoptysis and weight loss. Chest tightness. Patient feels like food gets stuck in his throat. EXAM: CT CHEST WITH CONTRAST TECHNIQUE: Multidetector CT imaging of  the chest was performed during intravenous contrast administration. CONTRAST:  80mL OMNIPAQUE IOHEXOL 300 MG/ML  SOLN COMPARISON:  Chest CT dated 03/28/2016 FINDINGS: Cardiovascular: There is no cardiomegaly or pericardial effusion. There is atherosclerotic calcification of the aortic valve. The thoracic aorta appears unremarkable. The origins of the great vessels of the aortic arch are patent. The central pulmonary arteries appear unremarkable for the degree of opacification. Evaluation of the pulmonary arteries is limited due to suboptimal opacification and timing of the contrast. Mediastinum/Nodes: Top-normal right hilar lymph node measures 11 mm in short axis. A relatively stable mildly enlarged lymph node anterior to the trachea (series 2, image 23) measures 13 mm. The esophagus is grossly unremarkable. Lungs/Pleura: Scattered nodular and ground-glass opacities involving  the lower lobes bilaterally as well as right upper lobe most consistent with an infectious process. Some of these nodules appear to have central cavitation. Differential diagnosis includes fungal or other atypical infections, TB, or less likely abscesses. Other etiologies are not excluded. Clinical correlation is recommended. There is no pleural effusion or pneumothorax. The central airways are patent. Upper Abdomen: No acute abnormality. Musculoskeletal: No chest wall abnormality. No acute or significant osseous findings. IMPRESSION: Scattered nodular and ground-glass opacities involving the lower lobes as well as right upper lobe most consistent with an infectious process. Clinical correlation and follow-up recommended. Electronically Signed   By: Elgie CollardArash  Radparvar M.D.   On: 06/08/2018 19:50      LOS: 1 day   Time spent: More than 50% of that time was spent in counseling and/or coordination of care.  Lanae Boastamesh Jatoya Armbrister, MD [facility]  06/10/2018, 1:44 PM

## 2018-06-10 NOTE — Progress Notes (Signed)
[facility]    Date of Admission:  06/08/2018   Total days of antibiotics 3           ID: Darrell Matthews is a 53 y.o. male with  Multifocal cavitary pneumonia Principal Problem:   Multifocal pneumonia Active Problems:   Dysphagia   Hemoptysis   Weight loss    Subjective: He denies any further dysphagia.  No fever, chills, nightsweats  He is #20 lb weight loss over 2 years. Unclear if recent unintentional weight loss. He reports greiving the loss of fiancee where he didn't eat much in addn to depression.also very active with work as Administratorlandscaper. No hx of incarceration, volunteering for homeless shelter or tb contact  Medications:   feeding supplement (ENSURE ENLIVE)  237 mL Oral BID BM   [START ON 06/11/2018] feeding supplement (PRO-STAT SUGAR FREE 64)  30 mL Oral Daily   nicotine  14 mg Transdermal Daily   sodium chloride flush  3 mL Intravenous Q12H    Objective: Vital signs in last 24 hours: Temp:  [98 F (36.7 C)-98.6 F (37 C)] 98.1 F (36.7 C) (05/10 1426) Pulse Rate:  [64-84] 64 (05/10 1426) Resp:  [14-20] 14 (05/10 1426) BP: (120-129)/(73-81) 125/75 (05/10 1426) SpO2:  [98 %-99 %] 99 % (05/10 1426) Physical Exam  Constitutional: He is oriented to person, place, and time. He appears well-developed and well-nourished. No distress.  HENT:  Mouth/Throat: Oropharynx is clear and moist. No oropharyngeal exudate.  Cardiovascular: Normal rate, regular rhythm and normal heart sounds. Exam reveals no gallop and no friction rub.  No murmur heard.  Pulmonary/Chest: Effort normal and breath sounds normal. No respiratory distress. He has no wheezes.  Abdominal: Soft. Bowel sounds are normal. He exhibits no distension. There is no tenderness.  Lymphadenopathy:  He has no cervical adenopathy.  Neurological: He is alert and oriented to person, place, and time.  Skin: Skin is warm and dry. No rash noted. No erythema.  Psychiatric: He has a  normal mood and affect. His behavior is normal.     Lab Results Recent Labs    06/08/18 1742 06/09/18 0837 06/10/18 0412  WBC 8.5 8.6 12.1*  HGB 12.2* 14.3 13.9  HCT 39.2 44.5 43.0  NA 136  --  139  K 4.0  --  3.7  CL 100  --  103  CO2 28  --  26  BUN 19  --  18  CREATININE 0.93  --  0.80   Liver Panel Recent Labs    06/08/18 1742  PROT 6.8  ALBUMIN 3.5  AST 13*  ALT 28  ALKPHOS 82  BILITOT 0.5    Microbiology: Blood cx ngtd Studies/Results: Ct Soft Tissue Neck W Contrast  Result Date: 06/08/2018 CLINICAL DATA:  Initial evaluation for palpable nodule or thyroid enlargement. EXAM: CT NECK WITH CONTRAST TECHNIQUE: Multidetector CT imaging of the neck was performed using the standard protocol following the bolus administration of intravenous contrast. CONTRAST:  80mL OMNIPAQUE IOHEXOL 300 MG/ML  SOLN COMPARISON:  None. FINDINGS: Pharynx and larynx: Oral cavity within normal limits. No acute abnormality about the dentition. Slight asymmetric prominence of the right palatine tonsil as compared to the left without discrete mass, of uncertain significance. Small calcified tonsillith noted on the right. No tonsillar or peritonsillar collection. Parapharyngeal fat maintained. Nasopharynx within normal limits. No retropharyngeal collection. Epiglottis normal. Vallecula clear. Remainder of the hypopharynx and supraglottic larynx within normal limits. True cords symmetric and normal.  Subglottic airway clear. Salivary glands: Salivary glands including the parotid and submandibular glands are normal. Thyroid: Thyroid normal. No discrete thyroid nodule or mass. Lymph nodes: Single mildly enlarged left level IB node measures 12 mm in short axis, indeterminate. Additional shotty subcentimeter lymph nodes noted within the neck bilaterally. No pathologically enlarged lymph nodes identified within the neck. Few mildly prominent paratracheal lymph nodes measure up to 13 mm, indeterminate, but could be  reactive. Vascular: Normal intravascular enhancement seen throughout the neck. Limited intracranial: Unremarkable. Visualized orbits: Unremarkable. Mastoids and visualized paranasal sinuses: Paranasal sinuses are clear. Mastoid air cells and middle ear cavities are well pneumatized and free of fluid. Skeleton: No acute osseous abnormality. No discrete lytic or blastic osseous lesions. Prior ACDF at C5-C7 with solid arthrodesis. Patient is status post posterior instrumentation at C5-C7 as well. Upper chest: Patchy ground-glass opacities within the visualized right upper and left lower lobes, better evaluated on concomitant chest CT. Other: None. IMPRESSION: 1. Slight asymmetric prominence of the right palatine tonsil as compared to the left without discrete mass, of uncertain significance. Correlation with direct visualization recommended. 2. Single mildly enlarged 12 mm left level IB lymph node, indeterminate, but could be reactive. Clinical follow-up to resolution recommended. No other discrete adenopathy or mass within the neck. 3. Patchy ground-glass opacities within the visualized lungs, suspicious for possible infectious pneumonitis. Mildly enlarged mediastinal adenopathy suspected to be reactive. Findings better evaluated on concomitant chest CT. Electronically Signed   By: Rise Mu M.D.   On: 06/08/2018 20:27   Ct Chest W Contrast  Result Date: 06/08/2018 CLINICAL DATA:  53 year old male with hemoptysis and weight loss. Chest tightness. Patient feels like food gets stuck in his throat. EXAM: CT CHEST WITH CONTRAST TECHNIQUE: Multidetector CT imaging of the chest was performed during intravenous contrast administration. CONTRAST:  63mL OMNIPAQUE IOHEXOL 300 MG/ML  SOLN COMPARISON:  Chest CT dated 03/28/2016 FINDINGS: Cardiovascular: There is no cardiomegaly or pericardial effusion. There is atherosclerotic calcification of the aortic valve. The thoracic aorta appears unremarkable. The origins of  the great vessels of the aortic arch are patent. The central pulmonary arteries appear unremarkable for the degree of opacification. Evaluation of the pulmonary arteries is limited due to suboptimal opacification and timing of the contrast. Mediastinum/Nodes: Top-normal right hilar lymph node measures 11 mm in short axis. A relatively stable mildly enlarged lymph node anterior to the trachea (series 2, image 23) measures 13 mm. The esophagus is grossly unremarkable. Lungs/Pleura: Scattered nodular and ground-glass opacities involving the lower lobes bilaterally as well as right upper lobe most consistent with an infectious process. Some of these nodules appear to have central cavitation. Differential diagnosis includes fungal or other atypical infections, TB, or less likely abscesses. Other etiologies are not excluded. Clinical correlation is recommended. There is no pleural effusion or pneumothorax. The central airways are patent. Upper Abdomen: No acute abnormality. Musculoskeletal: No chest wall abnormality. No acute or significant osseous findings. IMPRESSION: Scattered nodular and ground-glass opacities involving the lower lobes as well as right upper lobe most consistent with an infectious process. Clinical correlation and follow-up recommended. Electronically Signed   By: Elgie Collard M.D.   On: 06/08/2018 19:50     Assessment/Plan: Multifocal pneumonia, with occasional cavitary lesion = he denies any sputum production. Recommend to treat for CAP, can switch over to oral regimen to see if tolerates oral abtx. Recommend to treat for 7 days.  - would recommend follow up cxr to see if lesions appear improved -  consider outpatient visit for pulmonary to see if needs further evaluation - appears stable and symptoms now appear less consistent with mTB but possibly NTM - will still need follow up on QTF.   [facility] Cell: [phone] Pager:  [phone]  06/10/2018, 5:09 PM

## 2018-06-10 NOTE — Progress Notes (Signed)
  Echocardiogram 2D Echocardiogram has been performed.  Darrell Matthews 06/10/2018, 10:26 AM

## 2018-06-11 ENCOUNTER — Inpatient Hospital Stay (HOSPITAL_COMMUNITY): Payer: Self-pay

## 2018-06-11 LAB — LEGIONELLA PNEUMOPHILA SEROGP 1 UR AG: L. pneumophila Serogp 1 Ur Ag: NEGATIVE

## 2018-06-11 MED ORDER — AZITHROMYCIN 250 MG PO TABS
500.0000 mg | ORAL_TABLET | Freq: Every day | ORAL | Status: DC
  Administered 2018-06-11: 500 mg via ORAL
  Filled 2018-06-11: qty 2

## 2018-06-11 MED ORDER — CEFDINIR 300 MG PO CAPS
300.0000 mg | ORAL_CAPSULE | Freq: Two times a day (BID) | ORAL | Status: DC
  Administered 2018-06-11 – 2018-06-12 (×2): 300 mg via ORAL
  Filled 2018-06-11 (×2): qty 1

## 2018-06-11 NOTE — Progress Notes (Signed)
SLP Cancellation Note  Patient Details Name: ODAY LOTHIAN MRN: [id number] DOB: 19-Sep-1965   Cancelled treatment:       Reason Eval/Treat Not Completed: Other (comment)(pt continues on airborne precautions, TB testing not back yet, will continue efforts, MBS indicated when pt able to leave airborne room, thanks. )   Chales Abrahams 06/11/2018, 3:45 PM  Donavan Burnet, MS [facility] SLP Acute Rehab Services Pager [phone] Office [phone]

## 2018-06-11 NOTE — Progress Notes (Signed)
PHARMACIST - PHYSICIAN COMMUNICATION  DR:   Lanae Boast  CONCERNING: IV to Oral Route Change Policy  RECOMMENDATION: This patient is receiving azithromycin by the intravenous route.  Based on criteria approved by the Pharmacy and Therapeutics Committee, the intravenous medication(s) is/are being converted to the equivalent oral dose form(s).   DESCRIPTION: These criteria include:  The patient is eating (either orally or via tube) and/or has been taking other orally administered medications for a least 24 hours  The patient has no evidence of active gastrointestinal bleeding or impaired GI absorption (gastrectomy, short bowel, patient on TNA or NPO).  If you have questions about this conversion, please contact the Pharmacy Department  []   [phone] )  [facility] []   [phone] )  [facility] []   [phone] )  [facility] []   [phone] )  [facility] [x]   [phone] )  [facility]   Dorna Leitz [place], [facility] 06/11/2018 9:54 AM

## 2018-06-11 NOTE — Progress Notes (Signed)
PROGRESS NOTE    Jef Futch [facility]  VQQ:[id number] DOB: Jul 05, 1965 DOA: 06/08/2018 PCP: Patient, No Pcp Per   Brief Narrative: 53YOM w no pmh admitted with c/o Hemoptysis last on Thursday,which started 1 wk before that,some cough, sob, wt loss of 10 lb since then. No cp, fever chills, n/vabd pain. smokes 1/2 ppd x30 yo. Denies sick contact. In ER, CT Chest w contrast:"Scattered nodular and ground-glass opacities involving the lower lobes as well as right upper lobe most consistent with an infectious Process" CT neck w contrast: "Slight asymmetric prominence of the right palatine tonsil as compared to the left without discrete mass, of uncertain significance. Correlation with direct visualization recommended. Single mildly enlarged 12 mm left level IB lymph node, indeterminate, but could be reactive. Clinical follow-up to resolution recommended. No other discrete adenopathy or mass within the neck." Sputum for AFB ordered, started on antibiotics and admitted.  Subjective: Seen and examined this morning.  Patient feels well, denies any dysphagia fever chills cough.  No hematemesis or any cough.  Feels ready to go home.  Assessment & Plan:   Multifocal pneumonia: With areas of cavitary nodules, unintentional weight loss along with weeks of dysphagia.  Appreciate ID input.AFB sputum ordered but no cough.  Overall symptoms seems to be clinically improving on IV antibiotics with negative blood culture no evidence of endocarditis on echocardiogram, less likely MTB but pending quantiferon TB.Remains on airborne precaution. HIV negative. Strep and legionella ag negative. Continue on empiric ceftriaxone and azithromycin-we will change to p.o to see if he tolerates.covid 19 neg. Noted ID inputs, paged to d/w for further plan? ENT eval.?pulm eval, ?outpatient.  Dysphagia: improved. Speech on board, barium swallow study ordered but not abel to be done as he is on negative pressure room.CT neck with asymmetric  prominence of the right palatine tonsil without discrete mass.  Tobacco abuse, cessation advised.  History of drug abuse.  Patient reports taking drugs in the past Percocet and heroin but denies IV drug abuse.  Urine drug screen ordered   DVT prophylaxis: SCD Code Status: full Family Communication: POC discussed with the patient and verbalized understanding.  Advised not to leave against  medical advice before completing the treatment. Disposition Plan: remains inpatient pending clinical improvement. Discussed w Dr Drue Second, monitor overnight and if stable plan on d/c home in am and f/u wit ID clinic, doubt MTB at this time   Consultants:  ID  Procedures: None  Antimicrobials: Anti-infectives (From admission, onward)   Start     Dose/Rate Route Frequency Ordered Stop   06/11/18 2200  azithromycin (ZITHROMAX) tablet 500 mg     500 mg Oral Daily at bedtime 06/11/18 0954 06/15/18 2159   06/11/18 2200  cefdinir (OMNICEF) capsule 300 mg     300 mg Oral Every 12 hours 06/11/18 1209 06/16/18 0959   06/09/18 2200  cefTRIAXone (ROCEPHIN) 1 g in sodium chloride 0.9 % 100 mL IVPB  Status:  Discontinued     1 g 200 mL/hr over 30 Minutes Intravenous Every 24 hours 06/09/18 0134 06/11/18 1209   06/09/18 2200  azithromycin (ZITHROMAX) 500 mg in sodium chloride 0.9 % 250 mL IVPB  Status:  Discontinued     500 mg 250 mL/hr over 60 Minutes Intravenous Every 24 hours 06/09/18 0134 06/11/18 0954   06/08/18 2148  azithromycin (ZITHROMAX) 500 MG injection    Note to Pharmacy:  Estrella Deeds   : cabinet override      06/08/18 2148 06/09/18 4332  06/08/18 2130  cefTRIAXone (ROCEPHIN) 1 g in sodium chloride 0.9 % 100 mL IVPB     1 g 200 mL/hr over 30 Minutes Intravenous  Once 06/08/18 2126 06/08/18 2302   06/08/18 2130  azithromycin (ZITHROMAX) 500 mg in sodium chloride 0.9 % 250 mL IVPB     500 mg 250 mL/hr over 60 Minutes Intravenous  Once 06/08/18 2126 06/08/18 2302       Objective: Vitals:    06/10/18 0859 06/10/18 1426 06/10/18 2203 06/11/18 0629  BP: 124/73 125/75 117/72 121/79  Pulse: 84 64 64 (!) 54  Resp: 20 14  18   Temp: 98.6 F (37 C) 98.1 F (36.7 C)  98.2 F (36.8 C)  TempSrc: Oral Oral  Oral  SpO2: 98% 99% 96% 100%  Weight:      Height:        Intake/Output Summary (Last 24 hours) at 06/11/2018 1400 Last data filed at 06/11/2018 1249 Gross per 24 hour  Intake 753.38 ml  Output 1750 ml  Net -996.62 ml   Filed Weights   06/08/18 1726 06/09/18 0130  Weight: 67.1 kg 65.8 kg   Weight change:   Body mass index is 20.23 kg/m.  Intake/Output from previous day: 05/10 0701 - 05/11 0700 In: 753.4 [P.O.:360; I.V.:43.4; IV Piggyback:350] Out: 2100 [Urine:2100] Intake/Output this shift: Total I/O In: 3 [I.V.:3] Out: -   Examination: General exam: Calm, comfortable, not in acute distress, older for age, average built.  HEENT:Oral mucosa moist, Ear/Nose WNL grossly, dentition normal. Respiratory system: Bilateral equal air entry, no crackles and wheezing, no use of accessory muscle, non tender on palpation. Cardiovascular system: regular rate and rhythm, S1 & S2 heard, No JVD/murmurs. Gastrointestinal system: Abdomen soft, non-tender, non-distended, BS +. Nervous System:Alert, awake and oriented at baseline. Able to move UE and LE, sensation intact. Extremities: No edema, distal peripheral pulses palpable.  Skin: No rashes,no icterus. MSK: Normal muscle bulk,tone, power   Medications:  Scheduled Meds: . azithromycin  500 mg Oral QHS  . cefdinir  300 mg Oral Q12H  . feeding supplement (ENSURE ENLIVE)  237 mL Oral BID BM  . feeding supplement (PRO-STAT SUGAR FREE 64)  30 mL Oral Daily  . nicotine  14 mg Transdermal Daily  . sodium chloride flush  3 mL Intravenous Q12H   Continuous Infusions: . sodium chloride 10 mL/hr at 06/11/18 0200    Data Reviewed: I have personally reviewed following labs and imaging studies  CBC: Recent Labs  Lab 06/08/18  1742 06/09/18 0837 06/10/18 0412  WBC 8.5 8.6 12.1*  NEUTROABS 5.6 6.8  --   HGB 12.2* 14.3 13.9  HCT 39.2 44.5 43.0  MCV 92.5 91.9 91.7  PLT 189 199 211   Basic Metabolic Panel: Recent Labs  Lab 06/08/18 1742 06/09/18 0837 06/10/18 0412  NA 136 135 139  K 4.0 4.2 3.7  CL 100 100 103  CO2 28 26 26   GLUCOSE 105* 102* 107*  BUN 19 16 18   CREATININE 0.93 0.81 0.80  CALCIUM 9.0 8.9 9.0   GFR: Estimated Creatinine Clearance: 98.2 mL/min (by C-G formula based on SCr of 0.8 mg/dL). Liver Function Tests: Recent Labs  Lab 06/08/18 1742  AST 13*  ALT 28  ALKPHOS 82  BILITOT 0.5  PROT 6.8  ALBUMIN 3.5   No results for input(s): LIPASE, AMYLASE in the last 168 hours. No results for input(s): AMMONIA in the last 168 hours. Coagulation Profile: Recent Labs  Lab 06/08/18 1742  INR 1.0  Cardiac Enzymes: No results for input(s): CKTOTAL, CKMB, CKMBINDEX, TROPONINI in the last 168 hours. BNP (last 3 results) No results for input(s): PROBNP in the last 8760 hours. HbA1C: No results for input(s): HGBA1C in the last 72 hours. CBG: No results for input(s): GLUCAP in the last 168 hours. Lipid Profile: No results for input(s): CHOL, HDL, LDLCALC, TRIG, CHOLHDL, LDLDIRECT in the last 72 hours. Thyroid Function Tests: No results for input(s): TSH, T4TOTAL, FREET4, T3FREE, THYROIDAB in the last 72 hours. Anemia Panel: No results for input(s): VITAMINB12, FOLATE, FERRITIN, TIBC, IRON, RETICCTPCT in the last 72 hours. Sepsis Labs: No results for input(s): PROCALCITON, LATICACIDVEN in the last 168 hours.  Recent Results (from the past 240 hour(s))  Blood Culture (routine x 2)     Status: None (Preliminary result)   Collection Time: 06/08/18  9:30 PM  Result Value Ref Range Status   Specimen Description   Final    BLOOD RIGHT ARM Performed at [facility], [address] [place], [place] [address]    Special Requests   Final    BOTTLES DRAWN AEROBIC AND  ANAEROBIC Blood Culture adequate volume Performed at [facility], [address] [place], [place] [address]    Culture   Final    NO GROWTH 2 DAYS Performed at [facility], [address] [place], [place] [address]    Report Status PENDING  Incomplete  SARS Coronavirus 2 (Hosp order,Performed in [facility] lab via [employer] ID)     Status: None   Collection Time: 06/08/18  9:41 PM  Result Value Ref Range Status   SARS Coronavirus 2 (Abbott ID Now) NEGATIVE NEGATIVE Final    Comment: (NOTE) Interpretive Result Comment(s): COVID 19 Positive SARS CoV 2 target nucleic acids are DETECTED. The SARS CoV 2 RNA is generally detectable in upper and lower respiratory specimens during the acute phase of infection.  Positive results are indicative of active infection with SARS CoV 2.  Clinical correlation with patient history and other diagnostic information is necessary to determine patient infection status.  Positive results do not rule out bacterial infection or coinfection with other viruses. The expected result is Negative. COVID 19 Negative SARS CoV 2 target nucleic acids are NOT DETECTED. The SARS CoV 2 RNA is generally detectable in upper and lower respiratory specimens during the acute phase of infection.  Negative results do not preclude SARS CoV 2 infection, do not rule out coinfections with other pathogens, and should not be used as the sole basis for treatment or other patient management decisions.  Negative results must be combined with clinical  observations, patient history, and epidemiological information. The expected result is Negative. Invalid Presence or absence of SARS CoV 2 nucleic acids cannot be determined. Repeat testing was performed on the submitted specimen and repeated Invalid results were obtained.  If clinically indicated, additional testing on a new specimen with an alternate test methodology [phone]) is advised.  The SARS CoV 2 RNA is  generally detectable in upper and lower respiratory specimens during the acute phase of infection. The expected result is Negative. Fact Sheet for Patients:  http://www.graves-ford.org/ Fact Sheet for Healthcare Providers: EnviroConcern.si This test is not yet approved or cleared by the [place] FDA and has been authorized for detection and/or diagnosis of SARS CoV 2 by FDA under an Emergency Use Authorization (EUA).  This EUA will remain in effect (meaning this test can be used) for the duration of the  COVID19 d eclaration under Section 564(b)(1) of the Act, 21 U.S.C. section [id number] 3(b)(1), unless the authorization is terminated or revoked sooner. Performed at [facility] [place], [address] [place], [place] [address]   Blood Culture (routine x 2)     Status: None (Preliminary result)   Collection Time: 06/08/18  9:50 PM  Result Value Ref Range Status   Specimen Description   Final    BLOOD LEFT ARM Performed at [facility] [place] [facility], [address] [place], [place] [address]    Special Requests   Final    BOTTLES DRAWN AEROBIC AND ANAEROBIC Blood Culture adequate volume Performed at [facility] [place], [address] [place], [place] [address]    Culture   Final    NO GROWTH 2 DAYS Performed at [facility], [address] [place], [place] [address]    Report Status PENDING  Incomplete      Radiology Studies: Dg Chest 2 View  Result Date: 06/11/2018 CLINICAL DATA:  Multifocal pneumonia EXAM: CHEST - 2 VIEW COMPARISON:  CT chest, 06/08/2018, chest radiographs, 03/28/2016 FINDINGS: The heart size and mediastinal contours are within normal limits. Scattered ground-glass opacities better appreciated by prior CT do not appear significantly changed, most conspicuous in the right upper lobe. The visualized skeletal structures are unremarkable. IMPRESSION: Scattered ground-glass opacities better appreciated by  prior CT do not appear significantly changed, most conspicuous in the right upper lobe. No evident new airspace opacities. Electronically Signed   By: Lauralyn PrimesAlex  Bibbey M.D.   On: 06/11/2018 10:33      LOS: 2 days   Time spent: More than 50% of that time was spent in counseling and/or coordination of care.  Lanae Boastamesh Greyson Peavy, MD [facility]  06/11/2018, 2:00 PM

## 2018-06-11 NOTE — Progress Notes (Signed)
Subjective:  "I feel great and want to go home"   Antibiotics:  Anti-infectives (From admission, onward)   Start     Dose/Rate Route Frequency Ordered Stop   06/11/18 2200  azithromycin (ZITHROMAX) tablet 500 mg     500 mg Oral Daily at bedtime 06/11/18 0954 06/15/18 2159   06/11/18 2200  cefdinir (OMNICEF) capsule 300 mg     300 mg Oral Every 12 hours 06/11/18 1209 06/16/18 0959   06/09/18 2200  cefTRIAXone (ROCEPHIN) 1 g in sodium chloride 0.9 % 100 mL IVPB  Status:  Discontinued     1 g 200 mL/hr over 30 Minutes Intravenous Every 24 hours 06/09/18 0134 06/11/18 1209   06/09/18 2200  azithromycin (ZITHROMAX) 500 mg in sodium chloride 0.9 % 250 mL IVPB  Status:  Discontinued     500 mg 250 mL/hr over 60 Minutes Intravenous Every 24 hours 06/09/18 0134 06/11/18 0954   06/08/18 2148  azithromycin (ZITHROMAX) 500 MG injection    Note to Pharmacy:  Estrella Deeds   : cabinet override      06/08/18 2148 06/09/18 0959   06/08/18 2130  cefTRIAXone (ROCEPHIN) 1 g in sodium chloride 0.9 % 100 mL IVPB     1 g 200 mL/hr over 30 Minutes Intravenous  Once 06/08/18 2126 06/08/18 2302   06/08/18 2130  azithromycin (ZITHROMAX) 500 mg in sodium chloride 0.9 % 250 mL IVPB     500 mg 250 mL/hr over 60 Minutes Intravenous  Once 06/08/18 2126 06/08/18 2302      Medications: Scheduled Meds: . azithromycin  500 mg Oral QHS  . cefdinir  300 mg Oral Q12H  . feeding supplement (ENSURE ENLIVE)  237 mL Oral BID BM  . feeding supplement (PRO-STAT SUGAR FREE 64)  30 mL Oral Daily  . nicotine  14 mg Transdermal Daily  . sodium chloride flush  3 mL Intravenous Q12H   Continuous Infusions: . sodium chloride 10 mL/hr at 06/11/18 0200   PRN Meds:.sodium chloride, acetaminophen **OR** acetaminophen, bisacodyl, HYDROcodone-acetaminophen, LORazepam, ondansetron **OR** ondansetron (ZOFRAN) IV, senna-docusate, sodium chloride flush    Objective: Weight change:   Intake/Output Summary (Last 24  hours) at 06/11/2018 1615 Last data filed at 06/11/2018 1249 Gross per 24 hour  Intake 753.38 ml  Output 1750 ml  Net -996.62 ml   Blood pressure 94/66, pulse 60, temperature 98.3 F (36.8 C), temperature source Oral, resp. rate 18, height  (1.803 m), weight 65.8 kg, SpO2 94 %. Temp:  [98.2 F (36.8 C)-98.3 F (36.8 C)] 98.3 F (36.8 C) (05/11 1415) Pulse Rate:  [54-64] 60 (05/11 1415) Resp:  [18] 18 (05/11 1415) BP: (94-121)/(66-79) 94/66 (05/11 1415) SpO2:  [94 %-100 %] 94 % (05/11 1415)  Physical Exam: General: Alert and awake, oriented x3, not in any acute distress. HEENT: anicteric sclera, EOMI, no abnormal dentition CVS regular rate, normal  Chest: , no wheezing, no respiratory distress, lungs are clear throughout Abdomen: soft non-distended,  Extremities: no edema or deformity noted bilaterally Skin: no rashes Neuro: nonfocal  CBC:    BMET Recent Labs    06/09/18 0837 06/10/18 0412  NA 135 139  K 4.2 3.7  CL 100 103  CO2 26 26  GLUCOSE 102* 107*  BUN 16 18  CREATININE 0.81 0.80  CALCIUM 8.9 9.0     Liver Panel  Recent Labs    06/08/18 1742  PROT 6.8  ALBUMIN 3.5  AST 13*  ALT  28  ALKPHOS 82  BILITOT 0.5       Sedimentation Rate No results for input(s): ESRSEDRATE in the last 72 hours. C-Reactive Protein No results for input(s): CRP in the last 72 hours.  Micro Results: Recent Results (from the past 720 hour(s))  Blood Culture (routine x 2)     Status: None (Preliminary result)   Collection Time: 06/08/18  9:30 PM  Result Value Ref Range Status   Specimen Description   Final    BLOOD RIGHT ARM Performed at [facility], [address] [place], [place] [address]    Special Requests   Final    BOTTLES DRAWN AEROBIC AND ANAEROBIC Blood Culture adequate volume Performed at [facility], [address] [place], [place] [address]    Culture   Final    NO GROWTH 2 DAYS Performed at [facility],  [address], [place], [place] [address]    Report Status PENDING  Incomplete  SARS Coronavirus 2 (Hosp order,Performed in [facility] lab via [employer] ID)     Status: None   Collection Time: 06/08/18  9:41 PM  Result Value Ref Range Status   SARS Coronavirus 2 (Abbott ID Now) NEGATIVE NEGATIVE Final    Comment: (NOTE) Interpretive Result Comment(s): COVID 19 Positive SARS CoV 2 target nucleic acids are DETECTED. The SARS CoV 2 RNA is generally detectable in upper and lower respiratory specimens during the acute phase of infection.  Positive results are indicative of active infection with SARS CoV 2.  Clinical correlation with patient history and other diagnostic information is necessary to determine patient infection status.  Positive results do not rule out bacterial infection or coinfection with other viruses. The expected result is Negative. COVID 19 Negative SARS CoV 2 target nucleic acids are NOT DETECTED. The SARS CoV 2 RNA is generally detectable in upper and lower respiratory specimens during the acute phase of infection.  Negative results do not preclude SARS CoV 2 infection, do not rule out coinfections with other pathogens, and should not be used as the sole basis for treatment or other patient management decisions.  Negative results must be combined with clinical  observations, patient history, and epidemiological information. The expected result is Negative. Invalid Presence or absence of SARS CoV 2 nucleic acids cannot be determined. Repeat testing was performed on the submitted specimen and repeated Invalid results were obtained.  If clinically indicated, additional testing on a new specimen with an alternate test methodology [phone]) is advised.  The SARS CoV 2 RNA is generally detectable in upper and lower respiratory specimens during the acute phase of infection. The expected result is Negative. Fact Sheet for Patients:  http://www.graves-ford.org/  Fact Sheet for Healthcare Providers: EnviroConcern.si This test is not yet approved or cleared by the [place] FDA and has been authorized for detection and/or diagnosis of SARS CoV 2 by FDA under an Emergency Use Authorization (EUA).  This EUA will remain in effect (meaning this test can be used) for the duration of the COVID19 d eclaration under Section 564(b)(1) of the Act, 21 U.S.C. section [phone] 3(b)(1), unless the authorization is terminated or revoked sooner. Performed at [facility], [address] [place], [place] [address]   Blood Culture (routine x 2)     Status: None (Preliminary result)   Collection Time: 06/08/18  9:50 PM  Result Value Ref Range Status   Specimen Description   Final    BLOOD  LEFT ARM Performed at [facility] [place], [address] [place], [place] [address]    Special Requests   Final    BOTTLES DRAWN AEROBIC AND ANAEROBIC Blood Culture adequate volume Performed at [facility] [place], [address] [place], [place] [address]    Culture   Final    NO GROWTH 2 DAYS Performed at [facility], [address] [place], [place] [address]    Report Status PENDING  Incomplete    Studies/Results: Dg Chest 2 View  Result Date: 06/11/2018 CLINICAL DATA:  Multifocal pneumonia EXAM: CHEST - 2 VIEW COMPARISON:  CT chest, 06/08/2018, chest radiographs, 03/28/2016 FINDINGS: The heart size and mediastinal contours are within normal limits. Scattered ground-glass opacities better appreciated by prior CT do not appear significantly changed, most conspicuous in the right upper lobe. The visualized skeletal structures are unremarkable. IMPRESSION: Scattered ground-glass opacities better appreciated by prior CT do not appear significantly changed, most conspicuous in the right upper lobe. No evident new airspace opacities. Electronically Signed   By: Lauralyn PrimesAlex  Bibbey M.D.   On: 06/11/2018 10:33       Assessment/Plan:  INTERVAL HISTORY:   Pt feeling better    Principal Problem:   Multifocal pneumonia Active Problems:   Dysphagia   Hemoptysis   Weight loss    Darrell Matthews is a 53 y.o. male with  Nodular multifocal pneumonia with areas of cavitation being evaluated for possible TB.  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  This would also allow us to save N95 masks  I will arrange outpatient followup with our clinic.       LOS: 2 days   Acey LavCornelius Van Dam 06/11/2018, 4:15 PM

## 2018-06-12 LAB — HISTOPLASMA ANTIGEN, URINE: Histoplasma Antigen, urine: <0.5 (ref <0.5)

## 2018-06-12 MED ORDER — AMOXICILLIN-POT CLAVULANATE 875-125 MG PO TABS: 1.0000 | ORAL_TABLET | Freq: Two times a day (BID) | ORAL | 0 refills | Status: DC

## 2018-06-12 MED ORDER — IPRATROPIUM-ALBUTEROL 0.5-2.5 (3) MG/3ML IN SOLN
3.0000 mL | Freq: Four times a day (QID) | RESPIRATORY_TRACT | Status: DC | PRN
  Administered 2018-06-12: 3 mL via RESPIRATORY_TRACT
  Filled 2018-06-12: qty 3

## 2018-06-12 MED ORDER — AMOXICILLIN-POT CLAVULANATE 875-125 MG PO TABS: 1.0000 | ORAL_TABLET | Freq: Two times a day (BID) | ORAL | Status: DC

## 2018-06-12 NOTE — Progress Notes (Signed)
Duoneb PRN given to help facilitate ordered sputum induction. PT states he understands to spit in provided sterile cup. RN aware.

## 2018-06-12 NOTE — Plan of Care (Signed)
  Problem: Activity: Goal: Ability to tolerate increased activity will improve Outcome: Completed/Met   Problem: Clinical Measurements: Goal: Ability to maintain a body temperature in the normal range will improve Outcome: Completed/Met   Problem: Respiratory: Goal: Ability to maintain adequate ventilation will improve Outcome: Completed/Met Goal: Ability to maintain a clear airway will improve Outcome: Completed/Met   Problem: Health Behavior/Discharge Planning: Goal: Ability to manage health-related needs will improve Outcome: Completed/Met   Problem: Clinical Measurements: Goal: Ability to maintain clinical measurements within normal limits will improve Outcome: Completed/Met Goal: Will remain free from infection Outcome: Completed/Met Goal: Diagnostic test results will improve Outcome: Completed/Met Goal: Respiratory complications will improve Outcome: Completed/Met Goal: Cardiovascular complication will be avoided Outcome: Completed/Met   Problem: Activity: Goal: Risk for activity intolerance will decrease Outcome: Completed/Met   Problem: Nutrition: Goal: Adequate nutrition will be maintained Outcome: Completed/Met   Problem: Coping: Goal: Level of anxiety will decrease Outcome: Completed/Met   Problem: Elimination: Goal: Will not experience complications related to bowel motility Outcome: Completed/Met Goal: Will not experience complications related to urinary retention Outcome: Completed/Met   Problem: Pain Managment: Goal: General experience of comfort will improve Outcome: Completed/Met   Problem: Safety: Goal: Ability to remain free from injury will improve Outcome: Completed/Met   Problem: Skin Integrity: Goal: Risk for impaired skin integrity will decrease Outcome: Completed/Met

## 2018-06-12 NOTE — Progress Notes (Signed)
Patient very anxious at start of shift and threatening to leave AMA. Patient stated that the doctor told him he would be able to leave today and that the MD should keep his word. RN spoke with patient and informed him that he didn't have any orders to discharge yet. RN was able to convince patient to stay tonight and talk to the rounding MD in the am about discharge. Will continue to monitor patient.

## 2018-06-12 NOTE — Discharge Summary (Signed)
Physician Discharge Summary  [facility] ZOX:[id number] DOB: [birth year] DOA: 06/08/2018  PCP: Patient, No Pcp Per  Admit date: 06/08/2018 Discharge date: 06/12/2018  Admitted From: home Disposition:  home  Recommendations for Outpatient Follow-up:  1. Follow up with ID clinic in 1-2 weeks 2. Please obtain BMP/CBC in one week 3. Please follow up on the following pending results: including quantiferon  Home Health: no  Equipment/Devices:none Discharge Condition:stable  CODE STATUS:full  Diet recommendation: regular  Brief/Interim Summary:  53YOM w no pmh admitted with c/o Hemoptysis last on Thursday,which started 1 wk before that,some cough, sob, wt loss of 10 lb since then. No cp, fever chills, n/vabd pain. smokes 1/2 ppd x30 yo.Denies sick contact. In ER, CT Chest w contrast:"Scattered nodular and ground-glass opacities involving the lower lobes as well as right upper lobe most consistent with an infectious Process" CT neck w contrast: "Slight asymmetric prominence of the right palatine tonsil as compared to the left without discrete mass, of uncertain significance. Correlation with direct visualization recommended. Single mildly enlarged 12 mm left level IB lymph node, indeterminate, but could be reactive. Clinical follow-up to resolution recommended. No other discrete adenopathy or mass within the neck." Sputumfor AFB was ordered, started on antibiotics and admitted  Issues addressed:  Multifocal pneumonia: With areas of cavitary nodules: Suspecting pneumonia as dental caries in his right upper teeth and multiple fillings in the lower teeth but no recent dentist follow-up. 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 He c/o weeks of dysphagiaSeen by ID, patient not able to have any sputum for AFB check.  Attempted a sputum induction  with respiratory.  His symptoms had improved after initiation of antibiotics suspect this is pneumonia secondary to his dental caries possible aspiration.  I discussed with infectious disease specialist Dr. Daiva Eves this morning regarding plan of care and we will give him 10 more days of Augmentin.  Discussed with infectious disease and okay to discharge him home and they will follow-up as outpatient for further plan/imaging if needed.per discussion, will be challenging to have bronchoscopy especially given that his symptoms has improved and during this time of pandemic with limited resources/personnel.  Patient is adamant on going home today and is agreeable with follow-up with  ID,he is agreeable for respiratory sputum if we can get it before he leaves today and it will be followed by ID. So far negative blood culture no evidence of endocarditis on echocardiogram, neg strep, legionella antigen, HIV negative. Strep and legionella ag negative. COVID 19 was neg. Despite attempt to induce sputum unable to obtain sample. She was advised to use a surgical mask whenever he is in contact of depression or traveling.  Dysphagia: improved.Tolerating diet. CT neck with asymmetric prominence of the right palatine tonsil without discrete mass.  Advised outpatient follow-up.  Tobacco abuse, cessation advised.  History of drug abuse.  Patient reports taking drugs in the past Percocet and heroin but denies IV drug abuse.   I offered to update his ex-wife as per her request but patient asked me not to call anybody.  Discharge Diagnoses:  Principal Problem:   Multifocal pneumonia Active Problems:   Dysphagia   Hemoptysis   Weight loss    Discharge Instructions  Discharge Instructions    Call MD for:  difficulty breathing, headache or visual disturbances  Complete by:  As directed    Call Infectious disease office for follow up   Call MD for:  severe uncontrolled pain   Complete by:  As directed    Call  MD for:  temperature >100.4   Complete by:  As directed    Diet - low sodium heart healthy   Complete by:  As directed    Discharge instructions   Complete by:  As directed    Please call call MD or return to ER for similar recurring problem, nausea/vomiting, uncontrolled pain, abdominal pain chest pain, shortness of breath, fever. Please follow-up with ID clinic regarding further plan. Please avoid alcohol, smoking, or any other illicit substance.   Discharge instructions   Complete by:  As directed    Please call call MD or return to ER for similar recurring problem, nausea/vomiting, uncontrolled pain, abdominal pain chest pain, shortness of breath, fever. Please follow-up your doctor/Infectious disease as instructed in a week time and call the office for appointment fi you do nto hear from them. Please use surgical mask all the time whenever you are going out and in contact with other person. Please avoid alcohol, smoking, or any other illicit substance.   Increase activity slowly   Complete by:  As directed      Allergies as of 06/12/2018   No Known Allergies     Medication List    STOP taking these medications   lidocaine 5 % Commonly known as:  Lidoderm   meloxicam 7.5 MG tablet Commonly known as:  Mobic   methocarbamol 500 MG tablet Commonly known as:  ROBAXIN     TAKE these medications   acetaminophen 325 MG tablet Commonly known as:  TYLENOL Take 650 mg by mouth every 6 (six) hours as needed.   amoxicillin-clavulanate 875-125 MG tablet Commonly known as:  AUGMENTIN Take 1 tablet by mouth every 12 (twelve) hours for 10 days.   ibuprofen 200 MG tablet Commonly known as:  ADVIL Take 600 mg by mouth every 6 (six) hours as needed for headache, mild pain or moderate pain.   oxyCODONE-acetaminophen 10-325 MG tablet Commonly known as:  PERCOCET Take 1 tablet by mouth 2 (two) times daily as needed for pain.      Follow-up Information    Daiva Eves, Lisette Grinder, MD.  Call in 3 day(s).   Specialty:  Infectious Diseases Contact information: [address] [place] [address] [phone]          No Known Allergies  Consultations:  Infectious disease    Procedures/Studies: Dg Chest 2 View  Result Date: 06/11/2018 CLINICAL DATA:  Multifocal pneumonia EXAM: CHEST - 2 VIEW COMPARISON:  CT chest, 06/08/2018, chest radiographs, 03/28/2016 FINDINGS: The heart size and mediastinal contours are within normal limits. Scattered ground-glass opacities better appreciated by prior CT do not appear significantly changed, most conspicuous in the right upper lobe. The visualized skeletal structures are unremarkable. IMPRESSION: Scattered ground-glass opacities better appreciated by prior CT do not appear significantly changed, most conspicuous in the right upper lobe. No evident new airspace opacities. Electronically Signed   By: Lauralyn Primes M.D.   On: 06/11/2018 10:33   Ct Soft Tissue Neck W Contrast  Result Date: 06/08/2018 CLINICAL DATA:  Initial evaluation for palpable nodule or thyroid enlargement. EXAM: CT NECK WITH CONTRAST TECHNIQUE: Multidetector CT imaging of the neck was performed using the standard protocol following the bolus administration of intravenous contrast. CONTRAST:  80mL OMNIPAQUE IOHEXOL 300 MG/ML  SOLN  COMPARISON:  None. FINDINGS: Pharynx and larynx: Oral cavity within normal limits. No acute abnormality about the dentition. Slight asymmetric prominence of the right palatine tonsil as compared to the left without discrete mass, of uncertain significance. Small calcified tonsillith noted on the right. No tonsillar or peritonsillar collection. Parapharyngeal fat maintained. Nasopharynx within normal limits. No retropharyngeal collection. Epiglottis normal. Vallecula clear. Remainder of the hypopharynx and supraglottic larynx within normal limits. True cords symmetric and normal. Subglottic airway clear. Salivary glands: Salivary glands  including the parotid and submandibular glands are normal. Thyroid: Thyroid normal. No discrete thyroid nodule or mass. Lymph nodes: Single mildly enlarged left level IB node measures 12 mm in short axis, indeterminate. Additional shotty subcentimeter lymph nodes noted within the neck bilaterally. No pathologically enlarged lymph nodes identified within the neck. Few mildly prominent paratracheal lymph nodes measure up to 13 mm, indeterminate, but could be reactive. Vascular: Normal intravascular enhancement seen throughout the neck. Limited intracranial: Unremarkable. Visualized orbits: Unremarkable. Mastoids and visualized paranasal sinuses: Paranasal sinuses are clear. Mastoid air cells and middle ear cavities are well pneumatized and free of fluid. Skeleton: No acute osseous abnormality. No discrete lytic or blastic osseous lesions. Prior ACDF at C5-C7 with solid arthrodesis. Patient is status post posterior instrumentation at C5-C7 as well. Upper chest: Patchy ground-glass opacities within the visualized right upper and left lower lobes, better evaluated on concomitant chest CT. Other: None. IMPRESSION: 1. Slight asymmetric prominence of the right palatine tonsil as compared to the left without discrete mass, of uncertain significance. Correlation with direct visualization recommended. 2. Single mildly enlarged 12 mm left level IB lymph node, indeterminate, but could be reactive. Clinical follow-up to resolution recommended. No other discrete adenopathy or mass within the neck. 3. Patchy ground-glass opacities within the visualized lungs, suspicious for possible infectious pneumonitis. Mildly enlarged mediastinal adenopathy suspected to be reactive. Findings better evaluated on concomitant chest CT. Electronically Signed   By: Rise MuBenjamin  McClintock M.D.   On: 06/08/2018 20:27   Ct Chest W Contrast  Result Date: 06/08/2018 CLINICAL DATA:  53 year old male with hemoptysis and weight loss. Chest tightness.  Patient feels like food gets stuck in his throat. EXAM: CT CHEST WITH CONTRAST TECHNIQUE: Multidetector CT imaging of the chest was performed during intravenous contrast administration. CONTRAST:  80mL OMNIPAQUE IOHEXOL 300 MG/ML  SOLN COMPARISON:  Chest CT dated 03/28/2016 FINDINGS: Cardiovascular: There is no cardiomegaly or pericardial effusion. There is atherosclerotic calcification of the aortic valve. The thoracic aorta appears unremarkable. The origins of the great vessels of the aortic arch are patent. The central pulmonary arteries appear unremarkable for the degree of opacification. Evaluation of the pulmonary arteries is limited due to suboptimal opacification and timing of the contrast. Mediastinum/Nodes: Top-normal right hilar lymph node measures 11 mm in short axis. A relatively stable mildly enlarged lymph node anterior to the trachea (series 2, image 23) measures 13 mm. The esophagus is grossly unremarkable. Lungs/Pleura: Scattered nodular and ground-glass opacities involving the lower lobes bilaterally as well as right upper lobe most consistent with an infectious process. Some of these nodules appear to have central cavitation. Differential diagnosis includes fungal or other atypical infections, TB, or less likely abscesses. Other etiologies are not excluded. Clinical correlation is recommended. There is no pleural effusion or pneumothorax. The central airways are patent. Upper Abdomen: No acute abnormality. Musculoskeletal: No chest wall abnormality. No acute or significant osseous findings. IMPRESSION: Scattered nodular and ground-glass opacities involving the lower lobes as well as right upper lobe most  consistent with an infectious process. Clinical correlation and follow-up recommended. Electronically Signed   By: Elgie Collard M.D.   On: 06/08/2018 19:50   (Echo, Carotid, EGD, Colonoscopy, ERCP)    Subjective: Resting well this morning alert oriented with no coughing no shortness of  breath no chest pain no fever chills.  No dysphagia.  Tolerating diet.  Again very adamant on going home.  Discharge Exam: Vitals:   06/12/18 0619 06/12/18 1052  BP:    Pulse: (!) 59   Resp:    Temp:    SpO2: 95% 92%   Vitals:   06/11/18 2206 06/12/18 0616 06/12/18 0619 06/12/18 1052  BP: 111/64 100/65    Pulse: (!) 57 (!) 48 (!) 59   Resp: 20 18    Temp: 98.5 F (36.9 C) 98.2 F (36.8 C)    TempSrc: Oral Oral    SpO2: 100% 95% 95% 92%  Weight:      Height:        General: Pt is alert, awake, not in acute distress Cardiovascular: RRR, S1/S2 +, no rubs, no gallops Respiratory: CTA bilaterally, no wheezing, no rhonchi Abdominal: Soft, NT, ND, bowel sounds + Extremities: no edema, no cyanosis   The results of significant diagnostics from this hospitalization (including imaging, microbiology, ancillary and laboratory) are listed below for reference.     Microbiology: Recent Results (from the past 240 hour(s))  Blood Culture (routine x 2)     Status: None (Preliminary result)   Collection Time: 06/08/18  9:30 PM  Result Value Ref Range Status   Specimen Description   Final    BLOOD RIGHT ARM Performed at [facility], [address] [place], [place] [address]    Special Requests   Final    BOTTLES DRAWN AEROBIC AND ANAEROBIC Blood Culture adequate volume Performed at [facility], [address] [place], [place] [address]    Culture   Final    NO GROWTH 3 DAYS Performed at [facility], [address] [place], [place] [address]    Report Status PENDING  Incomplete  SARS Coronavirus 2 (Hosp order,Performed in [facility] lab via [employer] ID)     Status: None   Collection Time: 06/08/18  9:41 PM  Result Value Ref Range Status   SARS Coronavirus 2 ([employer] ID Now) NEGATIVE NEGATIVE Final    Comment: (NOTE) Interpretive Result Comment(s): COVID 19 Positive SARS CoV 2 target nucleic acids are DETECTED. The SARS CoV 2 RNA is generally  detectable in upper and lower respiratory specimens during the acute phase of infection.  Positive results are indicative of active infection with SARS CoV 2.  Clinical correlation with patient history and other diagnostic information is necessary to determine patient infection status.  Positive results do not rule out bacterial infection or coinfection with other viruses. The expected result is Negative. COVID 19 Negative SARS CoV 2 target nucleic acids are NOT DETECTED. The SARS CoV 2 RNA is generally detectable in upper and lower respiratory specimens during the acute phase of infection.  Negative results do not preclude SARS CoV 2 infection, do not rule out coinfections with other pathogens, and should not be used as the sole basis for treatment or other patient management decisions.  Negative results must be combined with clinical  observations, patient history, and epidemiological information. The expected result is Negative. Invalid Presence or absence of SARS CoV 2 nucleic acids cannot be determined. Repeat testing was performed  on the submitted specimen and repeated Invalid results were obtained.  If clinically indicated, additional testing on a new specimen with an alternate test methodology [phone]) is advised.  The SARS CoV 2 RNA is generally detectable in upper and lower respiratory specimens during the acute phase of infection. The expected result is Negative. Fact Sheet for Patients:  http://www.graves-ford.org/ Fact Sheet for Healthcare Providers: EnviroConcern.si This test is not yet approved or cleared by the [place] FDA and has been authorized for detection and/or diagnosis of SARS CoV 2 by FDA under an Emergency Use Authorization (EUA).  This EUA will remain in effect (meaning this test can be used) for the duration of the COVID19 d eclaration under Section 564(b)(1) of the Act, 21 U.S.C. section 2238027728 3(b)(1),  unless the authorization is terminated or revoked sooner. Performed at [facility], [address] [place], [place] [address]   Blood Culture (routine x 2)     Status: None (Preliminary result)   Collection Time: 06/08/18  9:50 PM  Result Value Ref Range Status   Specimen Description   Final    BLOOD LEFT ARM Performed at [facility], [address] [place], [place] [address]    Special Requests   Final    BOTTLES DRAWN AEROBIC AND ANAEROBIC Blood Culture adequate volume Performed at [facility], [address] [place], [place] [address]    Culture   Final    NO GROWTH 3 DAYS Performed at [facility], [address] [place], [place] [address]    Report Status PENDING  Incomplete     Labs: BNP (last 3 results) No results for input(s): BNP in the last 8760 hours. Basic Metabolic Panel: Recent Labs  Lab 06/08/18 1742 06/09/18 0837 06/10/18 0412  NA 136 135 139  K 4.0 4.2 3.7  CL 100 100 103  CO2 28 26 26   GLUCOSE 105* 102* 107*  BUN 19 16 18   CREATININE 0.93 0.81 0.80  CALCIUM 9.0 8.9 9.0   Liver Function Tests: Recent Labs  Lab 06/08/18 1742  AST 13*  ALT 28  ALKPHOS 82  BILITOT 0.5  PROT 6.8  ALBUMIN 3.5   No results for input(s): LIPASE, AMYLASE in the last 168 hours. No results for input(s): AMMONIA in the last 168 hours. CBC: Recent Labs  Lab 06/08/18 1742 06/09/18 0837 06/10/18 0412  WBC 8.5 8.6 12.1*  NEUTROABS 5.6 6.8  --   HGB 12.2* 14.3 13.9  HCT 39.2 44.5 43.0  MCV 92.5 91.9 91.7  PLT 189 199 211   Cardiac Enzymes: No results for input(s): CKTOTAL, CKMB, CKMBINDEX, TROPONINI in the last 168 hours. BNP: Invalid input(s): POCBNP CBG: No results for input(s): GLUCAP in the last 168 hours. D-Dimer No results for input(s): DDIMER in the last 72 hours. Hgb A1c No results for input(s): HGBA1C in the last 72 hours. Lipid Profile No results for input(s): CHOL, HDL, LDLCALC, TRIG, CHOLHDL,  LDLDIRECT in the last 72 hours. Thyroid function studies No results for input(s): TSH, T4TOTAL, T3FREE, THYROIDAB in the last 72 hours.  Invalid input(s): FREET3 Anemia work up No results for input(s): VITAMINB12, FOLATE, FERRITIN, TIBC, IRON, RETICCTPCT in the last 72 hours. Urinalysis    Component Value Date/Time   COLORURINE YELLOW 06/08/2018 2051   APPEARANCEUR CLEAR 06/08/2018 2051   LABSPEC 1.020 06/08/2018 2051   PHURINE 6.0 06/08/2018 2051   GLUCOSEU NEGATIVE 06/08/2018 2051   HGBUR NEGATIVE 06/08/2018 2051  BILIRUBINUR NEGATIVE 06/08/2018 2051   KETONESUR NEGATIVE 06/08/2018 2051   PROTEINUR NEGATIVE 06/08/2018 2051   UROBILINOGEN 0.2 09/16/2012 0926   NITRITE NEGATIVE 06/08/2018 2051   LEUKOCYTESUR NEGATIVE 06/08/2018 2051   Sepsis Labs Invalid input(s): PROCALCITONIN,  WBC,  LACTICIDVEN Microbiology Recent Results (from the past 240 hour(s))  Blood Culture (routine x 2)     Status: None (Preliminary result)   Collection Time: 06/08/18  9:30 PM  Result Value Ref Range Status   Specimen Description   Final    BLOOD RIGHT ARM Performed at [facility], [address] [place], [place] [address]    Special Requests   Final    BOTTLES DRAWN AEROBIC AND ANAEROBIC Blood Culture adequate volume Performed at [facility], [address] [place], [place] [address]    Culture   Final    NO GROWTH 3 DAYS Performed at [facility], [address] [place], [place] [address]    Report Status PENDING  Incomplete  SARS Coronavirus 2 (Hosp order,Performed in [facility] lab via [employer] ID)     Status: None   Collection Time: 06/08/18  9:41 PM  Result Value Ref Range Status   SARS Coronavirus 2 ([employer] ID Now) NEGATIVE NEGATIVE Final    Comment: (NOTE) Interpretive Result Comment(s): COVID 19 Positive SARS CoV 2 target nucleic acids are DETECTED. The SARS CoV 2 RNA is generally detectable in upper and lower respiratory specimens during the  acute phase of infection.  Positive results are indicative of active infection with SARS CoV 2.  Clinical correlation with patient history and other diagnostic information is necessary to determine patient infection status.  Positive results do not rule out bacterial infection or coinfection with other viruses. The expected result is Negative. COVID 19 Negative SARS CoV 2 target nucleic acids are NOT DETECTED. The SARS CoV 2 RNA is generally detectable in upper and lower respiratory specimens during the acute phase of infection.  Negative results do not preclude SARS CoV 2 infection, do not rule out coinfections with other pathogens, and should not be used as the sole basis for treatment or other patient management decisions.  Negative results must be combined with clinical  observations, patient history, and epidemiological information. The expected result is Negative. Invalid Presence or absence of SARS CoV 2 nucleic acids cannot be determined. Repeat testing was performed on the submitted specimen and repeated Invalid results were obtained.  If clinically indicated, additional testing on a new specimen with an alternate test methodology [phone]) is advised.  The SARS CoV 2 RNA is generally detectable in upper and lower respiratory specimens during the acute phase of infection. The expected result is Negative. Fact Sheet for Patients:  http://www.graves-ford.org/ Fact Sheet for Healthcare Providers: EnviroConcern.si This test is not yet approved or cleared by the [place] FDA and has been authorized for detection and/or diagnosis of SARS CoV 2 by FDA under an Emergency Use Authorization (EUA).  This EUA will remain in effect (meaning this test can be used) for the duration of the COVID19 d eclaration under Section 564(b)(1) of the Act, 21 U.S.C. section [phone] 3(b)(1), unless the authorization is terminated or revoked  sooner. Performed at [facility], [address] [place], [place] [address]   Blood Culture (routine x 2)     Status: None (Preliminary result)   Collection Time: 06/08/18  9:50 PM  Result Value Ref Range Status   Specimen Description  Final    BLOOD LEFT ARM Performed at [facility], [address] [place], [place] [address]    Special Requests   Final    BOTTLES DRAWN AEROBIC AND ANAEROBIC Blood Culture adequate volume Performed at [facility], [address] [place], [place] [address]    Culture   Final    NO GROWTH 3 DAYS Performed at [facility], [address], [place], [place] [address]    Report Status PENDING  Incomplete     Time coordinating discharge: 25 minutes  SIGNED:   Lanae Boast, MD  [facility] 06/12/2018, 11:21 AM  If 7PM-7AM, please contact night-coverage [email or web address]

## 2018-06-13 LAB — QUANTIFERON-TB GOLD PLUS (RQFGPL)
QuantiFERON Mitogen Value: 7.27 IU/mL
QuantiFERON Nil Value: 0.02 IU/mL
QuantiFERON TB1 Ag Value: 0.02 IU/mL
QuantiFERON TB2 Ag Value: 0.02 IU/mL

## 2018-06-13 LAB — QUANTIFERON-TB GOLD PLUS: QuantiFERON-TB Gold Plus: NEGATIVE

## 2018-06-14 LAB — DRUG PROFILE, UR, 9 DRUGS (LABCORP)
Amphetamines, Urine: POSITIVE ng/mL — AB
Barbiturate, Ur: NEGATIVE ng/mL
Benzodiazepine Quant, Ur: NEGATIVE ng/mL
Cannabinoid Quant, Ur: POSITIVE ng/mL — AB
Cocaine (Metab.): NEGATIVE ng/mL
Methadone Screen, Urine: NEGATIVE ng/mL
Opiate Quant, Ur: POSITIVE ng/mL — AB
Phencyclidine, Ur: NEGATIVE ng/mL
Propoxyphene, Urine: NEGATIVE ng/mL

## 2018-06-14 LAB — CULTURE, BLOOD (ROUTINE X 2)
Culture: NO GROWTH
Culture: NO GROWTH
Special Requests: ADEQUATE
Special Requests: ADEQUATE

## 2018-06-22 ENCOUNTER — Emergency Department (HOSPITAL_COMMUNITY): Payer: Self-pay

## 2018-06-22 ENCOUNTER — Emergency Department (HOSPITAL_COMMUNITY)
Admission: EM | Admit: 2018-06-22 | Discharge: 2018-06-22 | Disposition: A | Discharge status: Home / Self Care | Payer: Self-pay | Attending: Emergency Medicine | Admitting: Emergency Medicine

## 2018-06-22 ENCOUNTER — Encounter (HOSPITAL_COMMUNITY): Payer: Self-pay | Admitting: Emergency Medicine

## 2018-06-22 ENCOUNTER — Other Ambulatory Visit: Payer: Self-pay

## 2018-06-22 DIAGNOSIS — W540XXA Bitten by dog, initial encounter: Secondary | ICD-10-CM | POA: Insufficient documentation

## 2018-06-22 DIAGNOSIS — S61451A Open bite of right hand, initial encounter: Secondary | ICD-10-CM

## 2018-06-22 DIAGNOSIS — R030 Elevated blood-pressure reading, without diagnosis of hypertension: Secondary | ICD-10-CM

## 2018-06-22 DIAGNOSIS — S61411A Laceration without foreign body of right hand, initial encounter: Secondary | ICD-10-CM | POA: Insufficient documentation

## 2018-06-22 DIAGNOSIS — Z23 Encounter for immunization: Secondary | ICD-10-CM | POA: Insufficient documentation

## 2018-06-22 DIAGNOSIS — F17210 Nicotine dependence, cigarettes, uncomplicated: Secondary | ICD-10-CM | POA: Insufficient documentation

## 2018-06-22 DIAGNOSIS — Y9289 Other specified places as the place of occurrence of the external cause: Secondary | ICD-10-CM | POA: Insufficient documentation

## 2018-06-22 DIAGNOSIS — Y93H2 Activity, gardening and landscaping: Secondary | ICD-10-CM | POA: Insufficient documentation

## 2018-06-22 DIAGNOSIS — Y990 Civilian activity done for income or pay: Secondary | ICD-10-CM | POA: Insufficient documentation

## 2018-06-22 MED ORDER — HYDROCODONE-ACETAMINOPHEN 5-325 MG PO TABS
2.0000 | ORAL_TABLET | Freq: Once | ORAL | Status: AC
  Administered 2018-06-22: 16:00:00 2 via ORAL
  Filled 2018-06-22: qty 2

## 2018-06-22 MED ORDER — AMOXICILLIN-POT CLAVULANATE 875-125 MG PO TABS
1.0000 | ORAL_TABLET | Freq: Once | ORAL | Status: AC
  Administered 2018-06-22: 1 via ORAL
  Filled 2018-06-22: qty 1

## 2018-06-22 MED ORDER — LIDOCAINE-EPINEPHRINE-TETRACAINE (LET) SOLUTION
3.0000 mL | Freq: Once | NASAL | Status: AC
  Administered 2018-06-22: 3 mL via TOPICAL
  Filled 2018-06-22: qty 3

## 2018-06-22 MED ORDER — AMOXICILLIN-POT CLAVULANATE 875-125 MG PO TABS: 1.0000 | ORAL_TABLET | Freq: Two times a day (BID) | ORAL | 0 refills | Status: AC

## 2018-06-22 MED ORDER — HYDROCODONE-ACETAMINOPHEN 5-325 MG PO TABS: 1.0000 | ORAL_TABLET | Freq: Four times a day (QID) | ORAL | 0 refills | Status: DC | PRN

## 2018-06-22 MED ORDER — TETANUS-DIPHTH-ACELL PERTUSSIS 5-2.5-18.5 LF-MCG/0.5 IM SUSP
0.5000 mL | Freq: Once | INTRAMUSCULAR | Status: AC
  Administered 2018-06-22: 0.5 mL via INTRAMUSCULAR
  Filled 2018-06-22: qty 0.5

## 2018-06-22 NOTE — ED Notes (Signed)
Pt given discharge instructions, follow up information and prescription information. Pt given the opportunity to ask questions. Pt verbalized understanding. Pt unable to sign e-signature due to hand injury.

## 2018-06-22 NOTE — ED Notes (Signed)
Patient transported to X-ray 

## 2018-06-22 NOTE — ED Notes (Signed)
Pt denied need to call animal control;

## 2018-06-22 NOTE — ED Triage Notes (Signed)
Pt with dog bite to the right hand/bleeding controlled with tissue. He reports the dog is up to date on the shots and his boss is handling the portion with animal control.
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
## 2018-06-22 NOTE — Discharge Instructions (Addendum)
Please see the information and instructions below regarding your visit.  Your diagnoses today include:  1. Dog bite of right hand, initial encounter   2. Elevated blood pressure reading      Tests performed today include: X-ray of the affected area that did not show any foreign bodies or broken bones Vital signs. See below for your results today.   Medications prescribed:   Take any prescribed medications only as directed.  You have been prescribed Norco for pain. This is an opioid pain medication. You may take this medication every 4-6 hours as needed for pain. Only take this medication if you need it for breakthrough pain. You may combine this medicine with ibuprofen, a non-steroidal anti-inflammatory drug (NSAID) every 6 hours, so you are getting something for pain relief every 3 hours.  Do not combine this medication with Tylenol, as it may increase the risk of liver problems.  Do not combine this medication with alcohol.  Please be advised to avoid driving or operating heavy machinery while taking this medication, as it may make you drowsy or impair judgment.   Please take all of your antibiotics until finished.   You may develop abdominal discomfort or nausea from the antibiotic. If this occurs, you may take it with food. Some patients also get diarrhea with antibiotics. You may help offset this with probiotics which you can buy or get in yogurt. Do not eat or take the probiotics until 2 hours after your antibiotic. Some women develop vaginal yeast infections after antibiotics. If you develop unusual vaginal discharge after being on this medication, please see your primary care provider.   Some people develop allergies to antibiotics. Symptoms of antibiotic allergy can be mild and include a flat rash and itching. They can also be more serious and include:  ?Hives - Hives are raised, red patches of skin that are usually very itchy.  ?Lip or tongue swelling  ?Trouble swallowing or  breathing  ?Blistering of the skin or mouth.  If you have any of these serious symptoms, please seek emergency medical care immediately.   Home care instructions:  Follow any educational materials and wound care instructions contained in this packet.   Keep affected area above the level of your heart when possible to minimize swelling. Wash area gently twice a day with warm soapy water. Do not apply alcohol or hydrogen peroxide directly over a wound. Cover the area if it is draining or weeping. Keep the bandage in place for 24 hours and refrain from getting the wound wet for 24 hours. After that, you may get the area wet, but please ensure that you dry it completely afterwards.  Please refrain from soaking sutures for long periods of time, or swimming in chlorinated water   You may apply antibiotic ointment such as Bacitracin or Neosporin.  Follow-up instructions: Call the hand surgeon, Dr. Izora Ribas on Tuesday to make an appointment.   Return instructions:  Return to the Emergency Department if you have: Fever Worsening pain Worsening swelling of the wound Pus draining from the wound Redness of the skin that moves away from the wound, especially if it streaks away from the affected area  Any other emergent concerns  Your vital signs today were: BP (!) 142/81    Pulse 87    Temp 98.4 F (36.9 C) (Oral)    Resp 18    Ht 5\' 9"  (1.753 m)    SpO2 98%    BMI 21.42 kg/m  If your blood  pressure (BP) was elevated on multiple readings during this visit above 130 for the top number or above 80 for the bottom number, please have this repeated by your primary care provider within one month. --------------  Thank you for allowing us to participate in your care today! It was a pleasure taking care of you.

## 2018-07-16 ENCOUNTER — Ambulatory Visit: Payer: Self-pay | Admitting: Infectious Disease

## 2018-10-30 ENCOUNTER — Emergency Department (HOSPITAL_BASED_OUTPATIENT_CLINIC_OR_DEPARTMENT_OTHER)
Admission: EM | Admit: 2018-10-30 | Discharge: 2018-10-30 | Disposition: A | Discharge status: Home / Self Care | Payer: Self-pay | Attending: Emergency Medicine | Admitting: Emergency Medicine

## 2018-10-30 ENCOUNTER — Emergency Department (HOSPITAL_COMMUNITY): Payer: Self-pay

## 2018-10-30 ENCOUNTER — Encounter (HOSPITAL_BASED_OUTPATIENT_CLINIC_OR_DEPARTMENT_OTHER): Payer: Self-pay | Admitting: Emergency Medicine

## 2018-10-30 ENCOUNTER — Other Ambulatory Visit: Payer: Self-pay

## 2018-10-30 ENCOUNTER — Emergency Department (HOSPITAL_BASED_OUTPATIENT_CLINIC_OR_DEPARTMENT_OTHER): Payer: Self-pay

## 2018-10-30 DIAGNOSIS — I7090 Unspecified atherosclerosis: Secondary | ICD-10-CM

## 2018-10-30 DIAGNOSIS — F17210 Nicotine dependence, cigarettes, uncomplicated: Secondary | ICD-10-CM | POA: Insufficient documentation

## 2018-10-30 DIAGNOSIS — Z79899 Other long term (current) drug therapy: Secondary | ICD-10-CM | POA: Insufficient documentation

## 2018-10-30 DIAGNOSIS — R42 Dizziness and giddiness: Secondary | ICD-10-CM | POA: Insufficient documentation

## 2018-10-30 DIAGNOSIS — H532 Diplopia: Secondary | ICD-10-CM | POA: Insufficient documentation

## 2018-10-30 DIAGNOSIS — W19XXXA Unspecified fall, initial encounter: Secondary | ICD-10-CM | POA: Insufficient documentation

## 2018-10-30 HISTORY — DX: Unspecified atherosclerosis: I70.90

## 2018-10-30 LAB — CBC WITH DIFFERENTIAL/PLATELET
Abs Immature Granulocytes: 0.02 10*3/uL (ref 0.00–0.07)
Basophils Absolute: 0 10*3/uL (ref 0.0–0.1)
Basophils Relative: 0 %
Eosinophils Absolute: 0.1 10*3/uL (ref 0.0–0.5)
Eosinophils Relative: 1 %
HCT: 45.3 % (ref 39.0–52.0)
Hemoglobin: 15.2 g/dL (ref 13.0–17.0)
Immature Granulocytes: 0 %
Lymphocytes Relative: 22 %
Lymphs Abs: 1.7 10*3/uL (ref 0.7–4.0)
MCH: 29.6 pg (ref 26.0–34.0)
MCHC: 33.6 g/dL (ref 30.0–36.0)
MCV: 88.3 fL (ref 80.0–100.0)
Monocytes Absolute: 0.6 10*3/uL (ref 0.1–1.0)
Monocytes Relative: 8 %
Neutro Abs: 5.2 10*3/uL (ref 1.7–7.7)
Neutrophils Relative %: 69 %
Platelets: 185 10*3/uL (ref 150–400)
RBC: 5.13 MIL/uL (ref 4.22–5.81)
RDW: 12.9 % (ref 11.5–15.5)
WBC: 7.6 10*3/uL (ref 4.0–10.5)
nRBC: 0 % (ref 0.0–0.2)

## 2018-10-30 LAB — COMPREHENSIVE METABOLIC PANEL
ALT: 11 U/L (ref 0–44)
AST: 9 U/L — ABNORMAL LOW (ref 15–41)
Albumin: 4 g/dL (ref 3.5–5.0)
Alkaline Phosphatase: 65 U/L (ref 38–126)
Anion gap: 9 (ref 5–15)
BUN: 20 mg/dL (ref 6–20)
CO2: 25 mmol/L (ref 22–32)
Calcium: 9.4 mg/dL (ref 8.9–10.3)
Chloride: 104 mmol/L (ref 98–111)
Creatinine, Ser: 0.74 mg/dL (ref 0.61–1.24)
GFR calc Af Amer: >60 mL/min (ref >60)
GFR calc non Af Amer: >60 mL/min (ref >60)
Glucose, Bld: 106 mg/dL — ABNORMAL HIGH (ref 70–99)
Potassium: 3.9 mmol/L (ref 3.5–5.1)
Sodium: 138 mmol/L (ref 135–145)
Total Bilirubin: 0.4 mg/dL (ref 0.3–1.2)
Total Protein: 7.9 g/dL (ref 6.5–8.1)

## 2018-10-30 MED ORDER — CLOPIDOGREL BISULFATE 75 MG PO TABS
75.0000 mg | ORAL_TABLET | Freq: Once | ORAL | Status: AC
  Administered 2018-10-30: 21:00:00 75 mg via ORAL
  Filled 2018-10-30: qty 1

## 2018-10-30 MED ORDER — GADOBUTROL 1 MMOL/ML IV SOLN
7.0000 mL | Freq: Once | INTRAVENOUS | Status: AC | PRN
  Administered 2018-10-30: 17:00:00 7 mL via INTRAVENOUS

## 2018-10-30 MED ORDER — ASPIRIN 81 MG PO CHEW: 81.0000 mg | CHEWABLE_TABLET | Freq: Every day | ORAL | 2 refills | Status: DC

## 2018-10-30 MED ORDER — ATORVASTATIN CALCIUM 10 MG PO TABS: 10.0000 mg | ORAL_TABLET | Freq: Every day | ORAL | 2 refills | Status: DC

## 2018-10-30 MED ORDER — ASPIRIN 81 MG PO CHEW
81.0000 mg | CHEWABLE_TABLET | Freq: Once | ORAL | Status: AC
  Administered 2018-10-30: 21:00:00 81 mg via ORAL
  Filled 2018-10-30: qty 1

## 2018-10-30 MED ORDER — CLOPIDOGREL BISULFATE 75 MG PO TABS: 75.0000 mg | ORAL_TABLET | Freq: Every day | ORAL | 2 refills | Status: DC

## 2018-10-30 MED ORDER — IOHEXOL 350 MG/ML SOLN
100.0000 mL | Freq: Once | INTRAVENOUS | Status: AC | PRN
  Administered 2018-10-30: 10:00:00 100 mL via INTRAVENOUS

## 2018-10-30 NOTE — ED Triage Notes (Signed)
Pt c/o dizziness x 1 month that pr reports worsened today. Pt report loc last night, and hitting head on floor. Pt denies confusion, denies chest pain. Pt reports blurry vision before diziness

## 2018-10-30 NOTE — ED Provider Notes (Signed)
Patient seen on arrival from our affiliated center. He is in no distress, awaiting MRI, neuro consult.  9:07 PM Patient has completed MRI, been evaluated by our neurology colleagues, and is designated for discharge. 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 Patient will start Plavix, aspirin, statin.  Patient will follow-up in clinic.   Carmin Muskrat, MD 10/30/18 2108

## 2018-10-30 NOTE — ED Notes (Signed)
Pt transported to MRI 

## 2018-10-30 NOTE — ED Notes (Signed)
Patient transported to CT 

## 2018-10-30 NOTE — ED Notes (Signed)
Pt transferred from [facility]. Waiting for MRI.

## 2018-10-30 NOTE — Discharge Instructions (Signed)
As discussed, your evaluation has been generally reassuring.  But, your MRI did demonstrate that there is some narrowing of the arteries supplying blood your brain. This may be contributing to your dizziness.  To minimize further changes, please take medication prescribed today as directed. The aspirin should be continued indefinitely, but the Plavix is only necessary for the next 3 months. Please call our affiliated primary care clinic for appropriate ongoing care.  Return here for concerning changes in your condition.
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
## 2019-05-02 ENCOUNTER — Emergency Department (HOSPITAL_COMMUNITY)
Admission: EM | Admit: 2019-05-02 | Discharge: 2019-05-02 | Disposition: A | Discharge status: Home / Self Care | Payer: Self-pay | Attending: Emergency Medicine | Admitting: Emergency Medicine

## 2019-05-02 ENCOUNTER — Other Ambulatory Visit: Payer: Self-pay

## 2019-05-02 ENCOUNTER — Encounter (HOSPITAL_COMMUNITY): Payer: Self-pay | Admitting: Emergency Medicine

## 2019-05-02 DIAGNOSIS — F17210 Nicotine dependence, cigarettes, uncomplicated: Secondary | ICD-10-CM | POA: Insufficient documentation

## 2019-05-02 DIAGNOSIS — T23201A Burn of second degree of right hand, unspecified site, initial encounter: Secondary | ICD-10-CM | POA: Insufficient documentation

## 2019-05-02 DIAGNOSIS — Y929 Unspecified place or not applicable: Secondary | ICD-10-CM | POA: Insufficient documentation

## 2019-05-02 DIAGNOSIS — Z7982 Long term (current) use of aspirin: Secondary | ICD-10-CM | POA: Insufficient documentation

## 2019-05-02 DIAGNOSIS — Z79899 Other long term (current) drug therapy: Secondary | ICD-10-CM | POA: Insufficient documentation

## 2019-05-02 DIAGNOSIS — Y999 Unspecified external cause status: Secondary | ICD-10-CM | POA: Insufficient documentation

## 2019-05-02 DIAGNOSIS — Y9389 Activity, other specified: Secondary | ICD-10-CM | POA: Insufficient documentation

## 2019-05-02 DIAGNOSIS — X141XXA Other contact with hot air and other hot gases, initial encounter: Secondary | ICD-10-CM | POA: Insufficient documentation

## 2019-05-02 MED ORDER — ACETAMINOPHEN 500 MG PO TABS: 500.0000 mg | ORAL_TABLET | Freq: Four times a day (QID) | ORAL | 0 refills | Status: DC | PRN

## 2019-05-02 MED ORDER — NAPROXEN 500 MG PO TABS
500.0000 mg | ORAL_TABLET | Freq: Once | ORAL | Status: AC
  Administered 2019-05-02: 500 mg via ORAL
  Filled 2019-05-02: qty 1

## 2019-05-02 MED ORDER — NAPROXEN 500 MG PO TABS: 500.0000 mg | ORAL_TABLET | Freq: Two times a day (BID) | ORAL | 0 refills | Status: DC

## 2019-05-02 MED ORDER — SILVER SULFADIAZINE 1 % EX CREA: 1.0000 "application " | TOPICAL_CREAM | Freq: Every day | CUTANEOUS | 0 refills | Status: DC

## 2019-05-02 MED ORDER — SILVER SULFADIAZINE 1 % EX CREA
TOPICAL_CREAM | Freq: Once | CUTANEOUS | Status: AC
  Filled 2019-05-02: qty 50
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
## 2019-05-02 NOTE — Discharge Instructions (Signed)
Keep the burn clean and dry. Apply silvadene cream to the area and cover with gauze/bandage. Change dressings twice daily. Wash with warm water and antibacterial soap such as dial soap in between dressing changes.   Take naproxen twice daily with meals for management of pain.  Do not take ibuprofen, Advil, Aleve, or Motrin while taking this medicine.  You can also take 1 to 2 tablets of extra strength Tylenol every 6 hours as needed for pain.  Apply ice 20 minutes at a time a few times daily to help with swelling.  Make sure to move the wrist and fingers to avoid stiffness.  Follow-up with a plastic surgeon or wound specialist will be extremely important.  I would recommend that you be reevaluated within 1 week.  I have put in referrals to our plastic surgeon and our wound care center but if you do not hear from them please call to schedule an appointment no later than Monday.  Return to the emergency department if any concerning signs or symptoms develop such as fevers, severe swelling, severe uncontrolled pain.
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
## 2019-05-02 NOTE — ED Notes (Signed)
Upon entering the room to D/C patient, patient was not there. He had stated earlier that he was ready to go and told that I would bring his D/C papers in when they were printed.
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
## 2019-05-02 NOTE — ED Notes (Signed)
Attempted to call patient to relay D/C instructions over the phone because patient left AMA, unable to reach patient at the listed number.
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
## 2019-08-21 ENCOUNTER — Emergency Department (HOSPITAL_COMMUNITY): Payer: Self-pay

## 2019-08-21 ENCOUNTER — Emergency Department (HOSPITAL_COMMUNITY)
Admission: EM | Admit: 2019-08-21 | Discharge: 2019-08-21 | Disposition: A | Discharge status: Home / Self Care | Payer: Self-pay | Attending: Emergency Medicine | Admitting: Emergency Medicine

## 2019-08-21 ENCOUNTER — Other Ambulatory Visit: Payer: Self-pay

## 2019-08-21 ENCOUNTER — Encounter (HOSPITAL_COMMUNITY): Payer: Self-pay | Admitting: Emergency Medicine

## 2019-08-21 DIAGNOSIS — M542 Cervicalgia: Secondary | ICD-10-CM | POA: Insufficient documentation

## 2019-08-21 DIAGNOSIS — R110 Nausea: Secondary | ICD-10-CM | POA: Insufficient documentation

## 2019-08-21 DIAGNOSIS — G8929 Other chronic pain: Secondary | ICD-10-CM | POA: Insufficient documentation

## 2019-08-21 DIAGNOSIS — F17210 Nicotine dependence, cigarettes, uncomplicated: Secondary | ICD-10-CM | POA: Insufficient documentation

## 2019-08-21 DIAGNOSIS — Z87442 Personal history of urinary calculi: Secondary | ICD-10-CM | POA: Insufficient documentation

## 2019-08-21 DIAGNOSIS — R102 Pelvic and perineal pain: Secondary | ICD-10-CM

## 2019-08-21 DIAGNOSIS — R1030 Lower abdominal pain, unspecified: Secondary | ICD-10-CM | POA: Insufficient documentation

## 2019-08-21 LAB — URINALYSIS, ROUTINE W REFLEX MICROSCOPIC
Bacteria, UA: NONE SEEN
Bilirubin Urine: NEGATIVE
Glucose, UA: NEGATIVE mg/dL
Hgb urine dipstick: NEGATIVE
Ketones, ur: NEGATIVE mg/dL
Nitrite: NEGATIVE
Protein, ur: 30 mg/dL — AB
Specific Gravity, Urine: 1.023 (ref 1.005–1.030)
pH: 5 (ref 5.0–8.0)

## 2019-08-21 LAB — COMPREHENSIVE METABOLIC PANEL
ALT: 15 U/L (ref 0–44)
AST: 12 U/L — ABNORMAL LOW (ref 15–41)
Albumin: 4 g/dL (ref 3.5–5.0)
Alkaline Phosphatase: 72 U/L (ref 38–126)
Anion gap: 8 (ref 5–15)
BUN: 18 mg/dL (ref 6–20)
CO2: 28 mmol/L (ref 22–32)
Calcium: 9.6 mg/dL (ref 8.9–10.3)
Chloride: 98 mmol/L (ref 98–111)
Creatinine, Ser: 1.16 mg/dL (ref 0.61–1.24)
GFR calc Af Amer: >60 mL/min (ref >60)
GFR calc non Af Amer: >60 mL/min (ref >60)
Glucose, Bld: 96 mg/dL (ref 70–99)
Potassium: 3.8 mmol/L (ref 3.5–5.1)
Sodium: 134 mmol/L — ABNORMAL LOW (ref 135–145)
Total Bilirubin: 0.7 mg/dL (ref 0.3–1.2)
Total Protein: 7.8 g/dL (ref 6.5–8.1)

## 2019-08-21 LAB — CBC
HCT: 49.6 % (ref 39.0–52.0)
Hemoglobin: 15.8 g/dL (ref 13.0–17.0)
MCH: 29 pg (ref 26.0–34.0)
MCHC: 31.9 g/dL (ref 30.0–36.0)
MCV: 91 fL (ref 80.0–100.0)
Platelets: 202 10*3/uL (ref 150–400)
RBC: 5.45 MIL/uL (ref 4.22–5.81)
RDW: 13.4 % (ref 11.5–15.5)
WBC: 13.5 10*3/uL — ABNORMAL HIGH (ref 4.0–10.5)
nRBC: 0 % (ref 0.0–0.2)

## 2019-08-21 LAB — LIPASE, BLOOD: Lipase: 22 U/L (ref 11–51)

## 2019-08-21 MED ORDER — KETOROLAC TROMETHAMINE 30 MG/ML IJ SOLN
30.0000 mg | Freq: Once | INTRAMUSCULAR | Status: AC
  Administered 2019-08-21: 30 mg via INTRAVENOUS
  Filled 2019-08-21: qty 1

## 2019-08-21 MED ORDER — ONDANSETRON 4 MG PO TBDP
4.0000 mg | ORAL_TABLET | Freq: Once | ORAL | Status: AC
  Administered 2019-08-21: 4 mg via ORAL
  Filled 2019-08-21: qty 1

## 2019-08-21 MED ORDER — SODIUM CHLORIDE 0.9% FLUSH
3.0000 mL | Freq: Once | INTRAVENOUS | Status: AC
  Administered 2019-08-21: 3 mL via INTRAVENOUS

## 2019-08-21 NOTE — Discharge Instructions (Addendum)
You have been seen here for suprapubic pain.  Labs and imaging look reassuring.  Recommend that you continue to stay hydrated, you may take over-the-counter pain medication like ibuprofen and Tylenol as needed for pain please follow dosing on the back of bottle.  I have given you the contact information for [facility]. They work with individuals with little to no insurance and can help you find a primary care provider.  Please contact them at your early convenience to help you find a primary care provider.  I want to come back to emergency department if you develop fever, chills, shortness of breath, chest pain, uncontrolled nausea, vomiting, diarrhea as the symptoms cannot further evaluation management.
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
## 2019-08-21 NOTE — ED Notes (Signed)
Patient verbalizes understanding of discharge instructions. Opportunity for questioning and answers were provided. Armband removed by staff, pt discharged from ED and ambulated to lobby to return home.   

## 2019-08-21 NOTE — ED Triage Notes (Signed)
Patient arrives to ED with complaints of suprapubic abdominal pain since Monday that continues to worsen. Patient states pain radiates to bilateral flanks. Patient denies N/V/D.

## 2019-08-21 NOTE — ED Notes (Signed)
Initial bladder scan 18:30 :   Bladder scan at 18:54: 60mL.

## 2019-08-22 LAB — URINE CULTURE: Culture: NO GROWTH
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
## 2020-08-12 ENCOUNTER — Emergency Department (HOSPITAL_COMMUNITY)
Admission: EM | Admit: 2020-08-12 | Discharge: 2020-08-12 | Disposition: A | Discharge status: Home / Self Care | Payer: 59 | Attending: Emergency Medicine | Admitting: Emergency Medicine

## 2020-08-12 ENCOUNTER — Other Ambulatory Visit: Payer: Self-pay

## 2020-08-12 ENCOUNTER — Emergency Department (HOSPITAL_COMMUNITY): Payer: 59

## 2020-08-12 DIAGNOSIS — N200 Calculus of kidney: Secondary | ICD-10-CM | POA: Diagnosis not present

## 2020-08-12 DIAGNOSIS — F17210 Nicotine dependence, cigarettes, uncomplicated: Secondary | ICD-10-CM | POA: Insufficient documentation

## 2020-08-12 DIAGNOSIS — Z7902 Long term (current) use of antithrombotics/antiplatelets: Secondary | ICD-10-CM | POA: Insufficient documentation

## 2020-08-12 DIAGNOSIS — R109 Unspecified abdominal pain: Secondary | ICD-10-CM | POA: Diagnosis present

## 2020-08-12 LAB — URINALYSIS, ROUTINE W REFLEX MICROSCOPIC
Bilirubin Urine: NEGATIVE
Glucose, UA: NEGATIVE mg/dL
Hgb urine dipstick: NEGATIVE
Ketones, ur: NEGATIVE mg/dL
Leukocytes,Ua: NEGATIVE
Nitrite: NEGATIVE
Protein, ur: NEGATIVE mg/dL
Specific Gravity, Urine: 1.016 (ref 1.005–1.030)
pH: 5 (ref 5.0–8.0)

## 2020-08-12 LAB — CBC WITH DIFFERENTIAL/PLATELET
Abs Immature Granulocytes: 0.04 10*3/uL (ref 0.00–0.07)
Basophils Absolute: 0.1 10*3/uL (ref 0.0–0.1)
Basophils Relative: 1 %
Eosinophils Absolute: 0.1 10*3/uL (ref 0.0–0.5)
Eosinophils Relative: 1 %
HCT: 44.8 % (ref 39.0–52.0)
Hemoglobin: 14.6 g/dL (ref 13.0–17.0)
Immature Granulocytes: 0 %
Lymphocytes Relative: 20 %
Lymphs Abs: 1.9 10*3/uL (ref 0.7–4.0)
MCH: 30.9 pg (ref 26.0–34.0)
MCHC: 32.6 g/dL (ref 30.0–36.0)
MCV: 94.7 fL (ref 80.0–100.0)
Monocytes Absolute: 0.8 10*3/uL (ref 0.1–1.0)
Monocytes Relative: 8 %
Neutro Abs: 6.5 10*3/uL (ref 1.7–7.7)
Neutrophils Relative %: 70 %
Platelets: 147 10*3/uL — ABNORMAL LOW (ref 150–400)
RBC: 4.73 MIL/uL (ref 4.22–5.81)
RDW: 12.9 % (ref 11.5–15.5)
WBC: 9.4 10*3/uL (ref 4.0–10.5)
nRBC: 0 % (ref 0.0–0.2)

## 2020-08-12 LAB — BASIC METABOLIC PANEL
Anion gap: 10 (ref 5–15)
BUN: 19 mg/dL (ref 6–20)
CO2: 23 mmol/L (ref 22–32)
Calcium: 9 mg/dL (ref 8.9–10.3)
Chloride: 106 mmol/L (ref 98–111)
Creatinine, Ser: 0.92 mg/dL (ref 0.61–1.24)
GFR, Estimated: >60 mL/min (ref >60)
Glucose, Bld: 99 mg/dL (ref 70–99)
Potassium: 3.8 mmol/L (ref 3.5–5.1)
Sodium: 139 mmol/L (ref 135–145)

## 2020-08-12 MED ORDER — KETOROLAC TROMETHAMINE 30 MG/ML IJ SOLN
30.0000 mg | Freq: Once | INTRAMUSCULAR | Status: AC
  Administered 2020-08-12: 30 mg via INTRAMUSCULAR
  Filled 2020-08-12: qty 1

## 2020-08-12 MED ORDER — ACETAMINOPHEN 500 MG PO TABS
1000.0000 mg | ORAL_TABLET | Freq: Once | ORAL | Status: AC
  Administered 2020-08-12: 1000 mg via ORAL
  Filled 2020-08-12: qty 2

## 2020-08-12 MED ORDER — LIDOCAINE 5 % EX PTCH: 1.0000 | MEDICATED_PATCH | CUTANEOUS | 0 refills | Status: DC

## 2020-08-12 MED ORDER — NAPROXEN 500 MG PO TABS
500.0000 mg | ORAL_TABLET | Freq: Two times a day (BID) | ORAL | 0 refills | Status: DC
Start: 2020-08-12 — End: 2021-07-09

## 2020-08-12 NOTE — ED Triage Notes (Signed)
Pt c/o left flank pain onset yesterday. Tenderness upper abdomen. HX kidney stones. Denies injury. Denies urinary problems. Denies fever/chills.

## 2020-08-12 NOTE — ED Notes (Signed)
Drinking ginger-ale and water without difficulty for PO challenge.

## 2020-08-12 NOTE — ED Notes (Signed)
ED Provider at bedside. 
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
## 2020-08-12 NOTE — ED Provider Notes (Signed)
Emergency Medicine Provider Triage Evaluation Note  Darrell Matthews [facility] , a 55 y.o. male  was evaluated in triage.  Pt complains of patient complains of left-sided flank pain that started yesterday and has worsened until now.  Reports history of kidney stones, but states that this feels worse.  He denies any nausea or vomiting.  Denies any urinary symptoms.  Denies any fevers or chills.  Denies any treatments prior to arrival..  Review of Systems  Positive: Flank pain Negative: Fever, chills  Physical Exam  BP (!) 146/86 (BP Location: Left Arm)   Pulse 79   Temp 97.8 F (36.6 C)   Resp 16   Ht 5\' 10"  (1.778 m)   Wt 72.6 kg   SpO2 97%   BMI 22.96 kg/m  Gen:   Awake, no distress   Resp:  Normal effort  MSK:   Moves extremities without difficulty  Other:    Medical Decision Making  Medically screening exam initiated at 5:10 AM.  Appropriate orders placed.  Darrell Matthews was informed that the remainder of the evaluation will be completed by another provider, this initial triage assessment does not replace that evaluation, and the importance of remaining in the ED until their evaluation is complete.  Flank pain   Donella Stade, PA-C 08/12/20 08/14/20    [id number], MD 08/12/20 [phone]

## 2020-08-12 NOTE — Discharge Instructions (Addendum)
Take the medications as prescribed  Return for new or worsening symptoms 

## 2021-01-05 ENCOUNTER — Ambulatory Visit (HOSPITAL_BASED_OUTPATIENT_CLINIC_OR_DEPARTMENT_OTHER): Payer: 59 | Admitting: Family Medicine

## 2021-01-21 ENCOUNTER — Encounter (HOSPITAL_BASED_OUTPATIENT_CLINIC_OR_DEPARTMENT_OTHER): Payer: Self-pay | Admitting: Family Medicine

## 2021-07-09 ENCOUNTER — Emergency Department (HOSPITAL_BASED_OUTPATIENT_CLINIC_OR_DEPARTMENT_OTHER): Payer: 59

## 2021-07-09 ENCOUNTER — Encounter (HOSPITAL_BASED_OUTPATIENT_CLINIC_OR_DEPARTMENT_OTHER): Payer: Self-pay | Admitting: *Deleted

## 2021-07-09 ENCOUNTER — Other Ambulatory Visit: Payer: Self-pay

## 2021-07-09 ENCOUNTER — Emergency Department (HOSPITAL_BASED_OUTPATIENT_CLINIC_OR_DEPARTMENT_OTHER)
Admission: EM | Admit: 2021-07-09 | Discharge: 2021-07-09 | Disposition: A | Discharge status: Home / Self Care | Payer: 59 | Attending: Emergency Medicine | Admitting: Emergency Medicine

## 2021-07-09 DIAGNOSIS — N200 Calculus of kidney: Secondary | ICD-10-CM | POA: Diagnosis not present

## 2021-07-09 DIAGNOSIS — Z7902 Long term (current) use of antithrombotics/antiplatelets: Secondary | ICD-10-CM | POA: Insufficient documentation

## 2021-07-09 DIAGNOSIS — R109 Unspecified abdominal pain: Secondary | ICD-10-CM | POA: Diagnosis present

## 2021-07-09 LAB — URINALYSIS, ROUTINE W REFLEX MICROSCOPIC
Bilirubin Urine: NEGATIVE
Glucose, UA: NEGATIVE mg/dL
Ketones, ur: NEGATIVE mg/dL
Leukocytes,Ua: NEGATIVE
Nitrite: NEGATIVE
RBC / HPF: >50 RBC/hpf — ABNORMAL HIGH (ref 0–5)
Specific Gravity, Urine: 1.016 (ref 1.005–1.030)
pH: 6 (ref 5.0–8.0)

## 2021-07-09 LAB — BASIC METABOLIC PANEL
Anion gap: 9 (ref 5–15)
BUN: 22 mg/dL — ABNORMAL HIGH (ref 6–20)
CO2: 26 mmol/L (ref 22–32)
Calcium: 9.6 mg/dL (ref 8.9–10.3)
Chloride: 102 mmol/L (ref 98–111)
Creatinine, Ser: 0.79 mg/dL (ref 0.61–1.24)
GFR, Estimated: >60 mL/min (ref >60)
Glucose, Bld: 83 mg/dL (ref 70–99)
Potassium: 3.7 mmol/L (ref 3.5–5.1)
Sodium: 137 mmol/L (ref 135–145)

## 2021-07-09 MED ORDER — IBUPROFEN 800 MG PO TABS: 800.0000 mg | ORAL_TABLET | Freq: Three times a day (TID) | ORAL | 0 refills | Status: DC

## 2021-07-09 NOTE — ED Triage Notes (Signed)
Pt is here for hematuria since Wednesday morning.  Pt has some left flank pain with this.  Pt has hx kidney stone

## 2021-07-09 NOTE — ED Provider Notes (Signed)
[facility]   CSN: [id number] Arrival date & time: 07/09/21  1622     History  Chief Complaint  Patient presents with   Hematuria    Darrell Matthews is a 56 y.o. male.  Patient is a 56 year old male who presents with left flank pain and hematuria.  It started 2 days ago.  Pain is in his left back and radiates a little bit to his left side.  He does have a prior history of kidney stones.  He says he has passed them all on their own.  He has never seen a urologist.  He has noted some blood-tinged urine.  No clots.  He denies any difficulty with urination.  No known fevers.  No nausea or vomiting.       Home Medications Prior to Admission medications   Medication Sig Start Date End Date Taking? Authorizing Provider  ibuprofen (ADVIL) 800 MG tablet Take 1 tablet (800 mg total) by mouth 3 (three) times daily. 07/09/21  Yes Rolan BuccoBelfi, Corlette Ciano, MD  acetaminophen (TYLENOL) 500 MG tablet Take 1 tablet (500 mg total) by mouth every 6 (six) hours as needed. Patient taking differently: Take 1,000 mg by mouth every 6 (six) hours as needed for mild pain. 05/02/19   Fawze, Mina A, PA-C  Aspirin-Acetaminophen (GOODYS BODY PAIN PO) Take 1 packet by mouth daily as needed (pain).    [provider]  atorvastatin (LIPITOR) 10 MG tablet Take 1 tablet (10 mg total) by mouth daily. Patient not taking: No sig reported 10/30/18   Gerhard MunchLockwood, Robert, MD  clopidogrel (PLAVIX) 75 MG tablet Take 1 tablet (75 mg total) by mouth daily. Patient not taking: No sig reported 10/30/18   Gerhard MunchLockwood, Robert, MD  lidocaine (LIDODERM) 5 % Place 1 patch onto the skin daily. Remove & Discard patch within 12 hours or as directed by MD 08/12/20   Henderly, Britni A, PA-C      Allergies    Patient has no known allergies.    Review of Systems   Review of Systems  Constitutional:  Negative for chills, diaphoresis, fatigue and fever.  HENT:  Negative for congestion, rhinorrhea and  sneezing.   Eyes: Negative.   Respiratory:  Negative for cough, chest tightness and shortness of breath.   Cardiovascular:  Negative for chest pain and leg swelling.  Gastrointestinal:  Negative for abdominal pain, blood in stool, diarrhea, nausea and vomiting.  Genitourinary:  Positive for flank pain and hematuria. Negative for difficulty urinating and frequency.  Musculoskeletal:  Negative for arthralgias and back pain.  Skin:  Negative for rash.  Neurological:  Negative for dizziness, speech difficulty, weakness, numbness and headaches.    Physical Exam Updated Vital Signs BP 137/84   Pulse 67   Temp 99.2 F (37.3 C) (Oral)   Resp 18   SpO2 97%  Physical Exam Constitutional:      Appearance: He is well-developed.  HENT:     Head: Normocephalic and atraumatic.  Eyes:     Pupils: Pupils are equal, round, and reactive to light.  Cardiovascular:     Rate and Rhythm: Normal rate and regular rhythm.     Heart sounds: Normal heart sounds.  Pulmonary:     Effort: Pulmonary effort is normal. No respiratory distress.     Breath sounds: Normal breath sounds. No wheezing or rales.  Chest:     Chest wall: No tenderness.  Abdominal:     General: Bowel sounds are normal.  Palpations: Abdomen is soft.     Tenderness: There is abdominal tenderness (Mild tenderness to the left flank). There is no guarding or rebound.  Musculoskeletal:        General: Normal range of motion.     Cervical back: Normal range of motion and neck supple.  Lymphadenopathy:     Cervical: No cervical adenopathy.  Skin:    General: Skin is warm and dry.     Findings: No rash.  Neurological:     Mental Status: He is alert and oriented to person, place, and time.     ED Results / Procedures / Treatments   Labs (all labs ordered are listed, but only abnormal results are displayed) Labs Reviewed  URINALYSIS, ROUTINE W REFLEX MICROSCOPIC - Abnormal; Notable for the following components:      Result Value    Color, Urine ORANGE (*)    APPearance HAZY (*)    Hgb urine dipstick LARGE (*)    Protein, ur TRACE (*)    RBC / HPF >50 (*)    All other components within normal limits  BASIC METABOLIC PANEL - Abnormal; Notable for the following components:   BUN 22 (*)    All other components within normal limits    EKG None  Radiology CT Renal Stone Study  Result Date: 07/09/2021 CLINICAL DATA:  Hematuria, flank pain EXAM: CT ABDOMEN AND PELVIS WITHOUT CONTRAST TECHNIQUE: Multidetector CT imaging of the abdomen and pelvis was performed following the standard protocol without IV contrast. RADIATION DOSE REDUCTION: This exam was performed according to the departmental dose-optimization program which includes automated exposure control, adjustment of the mA and/or kV according to patient size and/or use of iterative reconstruction technique. COMPARISON:  08/12/2020 FINDINGS: Lower chest: No acute abnormality Hepatobiliary: No focal hepatic abnormality. Gallbladder unremarkable. Pancreas: No focal abnormality or ductal dilatation. Spleen: No focal abnormality.  Normal size. Adrenals/Urinary Tract: 3 mm left renal pelvic stone without hydronephrosis. No stones or hydronephrosis on the right. Adrenal glands and urinary bladder unremarkable. Stomach/Bowel: Sigmoid diverticulosis. No active diverticulitis. Stomach and small bowel decompressed, unremarkable. Normal appendix. Vascular/Lymphatic: Aortic atherosclerosis. No evidence of aneurysm or adenopathy. Reproductive: No visible focal abnormality. Other: No free fluid or free air. Musculoskeletal: No acute bony abnormality. IMPRESSION: 3 mm left renal pelvic stone without hydronephrosis. Scattered sigmoid diverticulosis. Aortic atherosclerosis. Electronically Signed   By: Charlett Nose M.D.   On: 07/09/2021 19:12    Procedures Procedures    Medications Ordered in ED Medications - No data to display  ED Course/ Medical Decision Making/ A&P                            Medical Decision Making Problems Addressed: Kidney stone: acute illness or injury  Amount and/or Complexity of Data Reviewed Labs: ordered. Decision-making details documented in ED Course. Radiology: ordered.  Risk Prescription drug management. Decision regarding hospitalization.   Patient presents with left flank pain and hematuria.  His urine shows hematuria but there is no signs of infection.  His creatinine is normal on his blood work.  CT scan was performed which showed a 3 mm stone in the left renal pelvis.  No hydronephrosis or other acute abnormality.  He is pain is well controlled.  He has not required any pain medication.  He says he normally just takes ibuprofen for it at home.  This is been with his prior kidney stones.  He declines the need for opioid  pain medication.  He requested prescription for ibuprofen 800 mg tablets to be sent to the pharmacy.  I did this.  He has Flomax at home that he will start using.  I did give him a referral to follow-up with urology.  He was given other symptomatic care instructions.  At this point he feels appropriate for discharge.  I do not think that he needs inpatient treatment.  He was discharged home in good condition.  He was encouraged to follow-up with urologist.  Return precautions were given.  Final Clinical Impression(s) / ED Diagnoses Final diagnoses:  Kidney stone    Rx / DC Orders ED Discharge Orders          Ordered    ibuprofen (ADVIL) 800 MG tablet  3 times daily        07/09/21 2119              Rolan Bucco, MD 07/09/21 2147

## 2021-07-09 NOTE — Discharge Instructions (Addendum)
Follow-up with urologist as discussed.  Start taking the Flomax.  Return to emergency room if you have any worsening symptoms.

## 2021-08-23 ENCOUNTER — Emergency Department (HOSPITAL_BASED_OUTPATIENT_CLINIC_OR_DEPARTMENT_OTHER): Payer: 59

## 2021-08-23 ENCOUNTER — Other Ambulatory Visit: Payer: Self-pay

## 2021-08-23 ENCOUNTER — Emergency Department (HOSPITAL_BASED_OUTPATIENT_CLINIC_OR_DEPARTMENT_OTHER)
Admission: EM | Admit: 2021-08-23 | Discharge: 2021-08-23 | Disposition: A | Discharge status: Home / Self Care | Payer: 59 | Attending: Emergency Medicine | Admitting: Emergency Medicine

## 2021-08-23 ENCOUNTER — Encounter (HOSPITAL_BASED_OUTPATIENT_CLINIC_OR_DEPARTMENT_OTHER): Payer: Self-pay | Admitting: Emergency Medicine

## 2021-08-23 DIAGNOSIS — N200 Calculus of kidney: Secondary | ICD-10-CM | POA: Diagnosis not present

## 2021-08-23 DIAGNOSIS — D72829 Elevated white blood cell count, unspecified: Secondary | ICD-10-CM | POA: Insufficient documentation

## 2021-08-23 DIAGNOSIS — R109 Unspecified abdominal pain: Secondary | ICD-10-CM

## 2021-08-23 DIAGNOSIS — K5730 Diverticulosis of large intestine without perforation or abscess without bleeding: Secondary | ICD-10-CM | POA: Diagnosis not present

## 2021-08-23 LAB — COMPREHENSIVE METABOLIC PANEL
ALT: 10 U/L (ref 0–44)
AST: 13 U/L — ABNORMAL LOW (ref 15–41)
Albumin: 4.2 g/dL (ref 3.5–5.0)
Alkaline Phosphatase: 53 U/L (ref 38–126)
Anion gap: 7 (ref 5–15)
BUN: 25 mg/dL — ABNORMAL HIGH (ref 6–20)
CO2: 26 mmol/L (ref 22–32)
Calcium: 9.6 mg/dL (ref 8.9–10.3)
Chloride: 106 mmol/L (ref 98–111)
Creatinine, Ser: 0.97 mg/dL (ref 0.61–1.24)
GFR, Estimated: >60 mL/min (ref >60)
Glucose, Bld: 100 mg/dL — ABNORMAL HIGH (ref 70–99)
Potassium: 4.3 mmol/L (ref 3.5–5.1)
Sodium: 139 mmol/L (ref 135–145)
Total Bilirubin: 0.6 mg/dL (ref 0.3–1.2)
Total Protein: 7.1 g/dL (ref 6.5–8.1)

## 2021-08-23 LAB — CBC WITH DIFFERENTIAL/PLATELET
Abs Immature Granulocytes: 0.06 10*3/uL (ref 0.00–0.07)
Basophils Absolute: 0.1 10*3/uL (ref 0.0–0.1)
Basophils Relative: 1 %
Eosinophils Absolute: 0.1 10*3/uL (ref 0.0–0.5)
Eosinophils Relative: 1 %
HCT: 40.5 % (ref 39.0–52.0)
Hemoglobin: 14 g/dL (ref 13.0–17.0)
Immature Granulocytes: 1 %
Lymphocytes Relative: 19 %
Lymphs Abs: 2 10*3/uL (ref 0.7–4.0)
MCH: 32.1 pg (ref 26.0–34.0)
MCHC: 34.6 g/dL (ref 30.0–36.0)
MCV: 92.9 fL (ref 80.0–100.0)
Monocytes Absolute: 0.6 10*3/uL (ref 0.1–1.0)
Monocytes Relative: 6 %
Neutro Abs: 7.9 10*3/uL — ABNORMAL HIGH (ref 1.7–7.7)
Neutrophils Relative %: 72 %
Platelets: 161 10*3/uL (ref 150–400)
RBC: 4.36 MIL/uL (ref 4.22–5.81)
RDW: 13.2 % (ref 11.5–15.5)
WBC: 10.6 10*3/uL — ABNORMAL HIGH (ref 4.0–10.5)
nRBC: 0 % (ref 0.0–0.2)

## 2021-08-23 LAB — URINALYSIS, ROUTINE W REFLEX MICROSCOPIC
Bilirubin Urine: NEGATIVE
Glucose, UA: NEGATIVE mg/dL
Ketones, ur: NEGATIVE mg/dL
Leukocytes,Ua: NEGATIVE
Nitrite: NEGATIVE
Protein, ur: 30 mg/dL — AB
RBC / HPF: >50 RBC/hpf — ABNORMAL HIGH (ref 0–5)
Specific Gravity, Urine: 1.026 (ref 1.005–1.030)
pH: 6.5 (ref 5.0–8.0)

## 2021-08-23 MED ORDER — TAMSULOSIN HCL 0.4 MG PO CAPS
0.4000 mg | ORAL_CAPSULE | Freq: Every day | ORAL | 0 refills | Status: AC
Start: 2021-08-23 — End: 2021-08-30

## 2021-08-23 MED ORDER — KETOROLAC TROMETHAMINE 15 MG/ML IJ SOLN
15.0000 mg | Freq: Once | INTRAMUSCULAR | Status: AC
  Administered 2021-08-23: 15 mg via INTRAVENOUS
  Filled 2021-08-23: qty 1

## 2021-08-23 NOTE — Discharge Instructions (Signed)
Use Tylenol every 4 hours or ibuprofen every 6 hours needed for pain.  Follow-up with urology.  Flomax prescribed.

## 2021-08-23 NOTE — ED Triage Notes (Signed)
Pt arrives to ED with c/o left sided flank pain and hematuria. Pt reports both symptoms started x1 month ago after kidney stone dx on 6/9. He reports the pain typically starts late at night and continues into the morning, usually with no pain during the day. He reports he experiences a large amount of bloody urine every morning.

## 2021-08-23 NOTE — ED Provider Notes (Signed)
[facility] EMERGENCY DEPT Provider Note   CSN: [id number] Arrival date & time: 08/23/21  1022     History  Chief Complaint  Patient presents with   Flank Pain   Hematuria    Darrell Matthews is a 56 y.o. male.  Patient presents with left flank pain intermittent and hematuria intermittent for the past month.  Patient diagnosed kidney stone on June 9.  Patient does not think he has passed the stone.  No fevers chills or vomiting.  Patient able to tolerate oral fluids.  Pain overall controlled.       Home Medications Prior to Admission medications   Medication Sig Start Date End Date Taking? Authorizing Provider  tamsulosin (FLOMAX) 0.4 MG CAPS capsule Take 1 capsule (0.4 mg total) by mouth daily for 7 days. 08/23/21 08/30/21 Yes Blane Ohara, MD  acetaminophen (TYLENOL) 500 MG tablet Take 1 tablet (500 mg total) by mouth every 6 (six) hours as needed. Patient taking differently: Take 1,000 mg by mouth every 6 (six) hours as needed for mild pain. 05/02/19   Fawze, Mina A, PA-C  Aspirin-Acetaminophen (GOODYS BODY PAIN PO) Take 1 packet by mouth daily as needed (pain).    [provider]  atorvastatin (LIPITOR) 10 MG tablet Take 1 tablet (10 mg total) by mouth daily. Patient not taking: No sig reported 10/30/18   Gerhard Munch, MD  clopidogrel (PLAVIX) 75 MG tablet Take 1 tablet (75 mg total) by mouth daily. Patient not taking: No sig reported 10/30/18   Gerhard Munch, MD  ibuprofen (ADVIL) 800 MG tablet Take 1 tablet (800 mg total) by mouth 3 (three) times daily. 07/09/21   Rolan Bucco, MD  lidocaine (LIDODERM) 5 % Place 1 patch onto the skin daily. Remove & Discard patch within 12 hours or as directed by MD 08/12/20   Henderly, Britni A, PA-C      Allergies    Patient has no known allergies.    Review of Systems   Review of Systems  Constitutional:  Negative for chills and fever.  HENT:  Negative for congestion.   Eyes:  Negative for visual  disturbance.  Respiratory:  Negative for shortness of breath.   Cardiovascular:  Negative for chest pain.  Gastrointestinal:  Negative for abdominal pain and vomiting.  Genitourinary:  Positive for flank pain. Negative for dysuria.  Musculoskeletal:  Negative for back pain, neck pain and neck stiffness.  Skin:  Negative for rash.  Neurological:  Negative for light-headedness and headaches.    Physical Exam Updated Vital Signs BP 140/82   Pulse (!) 53   Temp 97.9 F (36.6 C) (Temporal)   Resp 16   Ht 5\' 10"  (1.778 m)   Wt 77.1 kg   SpO2 97%   BMI 24.39 kg/m  Physical Exam Vitals and nursing note reviewed.  Constitutional:      General: He is not in acute distress.    Appearance: He is well-developed.  HENT:     Head: Normocephalic and atraumatic.     Mouth/Throat:     Mouth: Mucous membranes are moist.  Eyes:     General:        Right eye: No discharge.        Left eye: No discharge.     Conjunctiva/sclera: Conjunctivae normal.  Neck:     Trachea: No tracheal deviation.  Cardiovascular:     Rate and Rhythm: Normal rate.  Pulmonary:     Effort: Pulmonary effort is normal.  Abdominal:  General: There is no distension.     Palpations: Abdomen is soft.     Tenderness: There is abdominal tenderness (mild left flank posterior). There is no guarding.  Musculoskeletal:     Cervical back: Normal range of motion and neck supple. No rigidity.  Skin:    General: Skin is warm.     Capillary Refill: Capillary refill takes less than 2 seconds.     Findings: No rash.  Neurological:     General: No focal deficit present.     Mental Status: He is alert.     Cranial Nerves: No cranial nerve deficit.  Psychiatric:        Mood and Affect: Mood normal.     ED Results / Procedures / Treatments   Labs (all labs ordered are listed, but only abnormal results are displayed) Labs Reviewed  CBC WITH DIFFERENTIAL/PLATELET - Abnormal; Notable for the following components:       Result Value   WBC 10.6 (*)    Neutro Abs 7.9 (*)    All other components within normal limits  COMPREHENSIVE METABOLIC PANEL - Abnormal; Notable for the following components:   Glucose, Bld 100 (*)    BUN 25 (*)    AST 13 (*)    All other components within normal limits  URINALYSIS, ROUTINE W REFLEX MICROSCOPIC - Abnormal; Notable for the following components:   APPearance HAZY (*)    Hgb urine dipstick LARGE (*)    Protein, ur 30 (*)    RBC / HPF >50 (*)    All other components within normal limits  URINE CULTURE    EKG None  Radiology CT Renal Stone Study  Result Date: 08/23/2021 CLINICAL DATA:  Left-sided flank pain and hematuria. Renal stone suspected. EXAM: CT ABDOMEN AND PELVIS WITHOUT CONTRAST TECHNIQUE: Multidetector CT imaging of the abdomen and pelvis was performed following the standard protocol without IV contrast. RADIATION DOSE REDUCTION: This exam was performed according to the departmental dose-optimization program which includes automated exposure control, adjustment of the mA and/or kV according to patient size and/or use of iterative reconstruction technique. COMPARISON:  CT July 09, 2021 FINDINGS: Lower chest: No acute abnormality. Hepatobiliary: Unremarkable noncontrast enhanced appearance of the liver and gallbladder. No biliary ductal dilation. Pancreas: No pancreatic ductal dilation or evidence of acute inflammation. Spleen: No splenomegaly. Adrenals/Urinary Tract: Bilateral adrenal glands appear normal. No hydronephrosis. Similar positioning of the 3 mm stone in the left renal pelvis near the UPJ now with some peripelvic stranding. No obstructive ureteral or bladder calculi. Urinary bladder is unremarkable for degree of distension. Stomach/Bowel: No radiopaque enteric contrast material was administered. Stomach is unremarkable for degree of distension. No pathologic dilation of small or large bowel. Normal appendix and terminal ileum. Colonic diverticulosis without  findings of acute diverticulitis. Vascular/Lymphatic: Aortic atherosclerosis. Similar focal ectasia of the infrarenal abdominal aorta just proximal to the bifurcation measuring 2.3 cm with the aorta just proximal to this area measuring 1.7 mm, not aneurysmal by and no follow-up imaging required. No enlarged abdominal or pelvic lymph nodes. Reproductive: Dystrophic prostatic calcifications. Other: No significant abdominopelvic free fluid. Musculoskeletal: L5-S1 discogenic disease. IMPRESSION: 1. New mild left peripelvic stranding about the 3 mm stone in the renal pelvis near the UPJ without hydronephrosis recommend correlation with urinalysis to exclude infection. 2. Diverticulosis without findings of acute diverticulitis. 3.  Aortic Atherosclerosis (ICD10-I70.0). Electronically Signed   By: Maudry Mayhew M.D.   On: 08/23/2021 11:30    Procedures Procedures  Medications Ordered in ED Medications  ketorolac (TORADOL) 15 MG/ML injection 15 mg (15 mg Intravenous Given 08/23/21 1229)    ED Course/ Medical Decision Making/ A&P                           Medical Decision Making Amount and/or Complexity of Data Reviewed Labs: ordered. Radiology: ordered.  Risk Prescription drug management.   Patient presents with recurrent left flank pain and known kidney stone history.  Blood work ordered and reviewed with normal hemoglobin, mild leukocytosis 10.6 without fever, electrolytes and kidney function unremarkable. CT scan stone study ordered and reviewed independently with 3 mm stone in the left UVJ with mild stranding.  Patient has no fever, no vomiting no systemic symptoms.  Patient is well-hydrated clinically and normal conversation/interaction in the room.  Toradol ordered for pain.  Discussed Flomax and follow-up with urology in a couple days if he does not pass the stone.  Urine culture added on.  Urinalysis reviewed with hemoglobin no signs significant infection.  Pain controlled in the  ER.         Final Clinical Impression(s) / ED Diagnoses Final diagnoses:  Kidney stone on left side  Acute flank pain    Rx / DC Orders ED Discharge Orders          Ordered    tamsulosin (FLOMAX) 0.4 MG CAPS capsule  Daily        08/23/21 1256              Blane Ohara, MD 08/23/21 1259

## 2021-08-24 LAB — URINE CULTURE: Culture: NO GROWTH

## 2021-10-30 ENCOUNTER — Other Ambulatory Visit: Payer: Self-pay

## 2021-10-30 ENCOUNTER — Emergency Department (HOSPITAL_COMMUNITY): Payer: BC Managed Care – PPO

## 2021-10-30 ENCOUNTER — Emergency Department (HOSPITAL_COMMUNITY)
Admission: EM | Admit: 2021-10-30 | Discharge: 2021-10-31 | Disposition: A | Discharge status: Home / Self Care | Payer: BC Managed Care – PPO | Attending: Emergency Medicine | Admitting: Emergency Medicine

## 2021-10-30 DIAGNOSIS — N200 Calculus of kidney: Secondary | ICD-10-CM

## 2021-10-30 DIAGNOSIS — I700 Atherosclerosis of aorta: Secondary | ICD-10-CM

## 2021-10-30 DIAGNOSIS — R109 Unspecified abdominal pain: Secondary | ICD-10-CM | POA: Diagnosis not present

## 2021-10-30 LAB — URINALYSIS, ROUTINE W REFLEX MICROSCOPIC
Bacteria, UA: NONE SEEN
Bilirubin Urine: NEGATIVE
Glucose, UA: NEGATIVE mg/dL
Ketones, ur: NEGATIVE mg/dL
Leukocytes,Ua: NEGATIVE
Nitrite: NEGATIVE
Protein, ur: NEGATIVE mg/dL
RBC / HPF: >50 RBC/hpf — ABNORMAL HIGH (ref 0–5)
Specific Gravity, Urine: 1.015 (ref 1.005–1.030)
pH: 6 (ref 5.0–8.0)

## 2021-10-30 LAB — COMPREHENSIVE METABOLIC PANEL
ALT: 16 U/L (ref 0–44)
AST: 16 U/L (ref 15–41)
Albumin: 4.2 g/dL (ref 3.5–5.0)
Alkaline Phosphatase: 55 U/L (ref 38–126)
Anion gap: 9 (ref 5–15)
BUN: 20 mg/dL (ref 6–20)
CO2: 25 mmol/L (ref 22–32)
Calcium: 9.3 mg/dL (ref 8.9–10.3)
Chloride: 106 mmol/L (ref 98–111)
Creatinine, Ser: 1.05 mg/dL (ref 0.61–1.24)
GFR, Estimated: >60 mL/min (ref >60)
Glucose, Bld: 99 mg/dL (ref 70–99)
Potassium: 4.1 mmol/L (ref 3.5–5.1)
Sodium: 140 mmol/L (ref 135–145)
Total Bilirubin: 0.8 mg/dL (ref 0.3–1.2)
Total Protein: 7.5 g/dL (ref 6.5–8.1)

## 2021-10-30 LAB — CBC WITH DIFFERENTIAL/PLATELET
Abs Immature Granulocytes: 0.03 10*3/uL (ref 0.00–0.07)
Basophils Absolute: 0.1 10*3/uL (ref 0.0–0.1)
Basophils Relative: 1 %
Eosinophils Absolute: 0.1 10*3/uL (ref 0.0–0.5)
Eosinophils Relative: 1 %
HCT: 47 % (ref 39.0–52.0)
Hemoglobin: 15.7 g/dL (ref 13.0–17.0)
Immature Granulocytes: 0 %
Lymphocytes Relative: 23 %
Lymphs Abs: 2.3 10*3/uL (ref 0.7–4.0)
MCH: 31.9 pg (ref 26.0–34.0)
MCHC: 33.4 g/dL (ref 30.0–36.0)
MCV: 95.5 fL (ref 80.0–100.0)
Monocytes Absolute: 0.9 10*3/uL (ref 0.1–1.0)
Monocytes Relative: 8 %
Neutro Abs: 6.7 10*3/uL (ref 1.7–7.7)
Neutrophils Relative %: 67 %
Platelets: 158 10*3/uL (ref 150–400)
RBC: 4.92 MIL/uL (ref 4.22–5.81)
RDW: 14.4 % (ref 11.5–15.5)
WBC: 10.1 10*3/uL (ref 4.0–10.5)
nRBC: 0 % (ref 0.0–0.2)

## 2021-10-30 LAB — LIPASE, BLOOD: Lipase: 77 U/L — ABNORMAL HIGH (ref 11–51)

## 2021-10-30 MED ORDER — ONDANSETRON 4 MG PO TBDP
4.0000 mg | ORAL_TABLET | Freq: Once | ORAL | Status: AC
  Administered 2021-10-30: 4 mg via ORAL
  Filled 2021-10-30: qty 1

## 2021-10-30 NOTE — ED Triage Notes (Signed)
Pt here for L flank pain that has been intermittent for months. Pt states pain today got significantly worse and had nausea this morning. Pt reports urinating blood this morning as well and feeling bloated in his abd.

## 2021-10-30 NOTE — Progress Notes (Signed)
Multiple attempts to call patient from lobby. Registration looked around and didn't see him. States was from rehab and may have left AMA

## 2021-10-30 NOTE — ED Provider Triage Note (Signed)
Emergency Medicine Provider Triage Evaluation Note  Darrell Matthews [facility] , a 56 y.o. male  was evaluated in triage.  Pt complains of intermittent flank pain which is persisted over a period of months, greatly worsened today.  Patient has been diagnosed over the summer with a kidney stone.  He states he has had intermittent pain with this stone.  He endorses taking 2 full prescriptions of Flomax but continues to have issues.  He states that today the pain became much more severe, making him catch his breath due to the pain.  He also endorses nausea today but denies vomiting.  Patient endorses frank hematuria which began today  Review of Systems  Positive: As above Negative: As above  Physical Exam  BP (!) 159/99 (BP Location: Right Arm)   Pulse 77   Temp 98.1 F (36.7 C) (Oral)   Resp 17   SpO2 99%  Gen:   Awake, no distress   Resp:  Normal effort  MSK:   Moves extremities without difficulty  Other:    Medical Decision Making  Medically screening exam initiated at 6:07 PM.  Appropriate orders placed.  Darrell Matthews was informed that the remainder of the evaluation will be completed by another provider, this initial triage assessment does not replace that evaluation, and the importance of remaining in the ED until their evaluation is complete.     Darrell Matthews, [place] 10/30/21 [phone]

## 2021-10-31 ENCOUNTER — Emergency Department (HOSPITAL_COMMUNITY): Payer: BC Managed Care – PPO

## 2021-10-31 MED ORDER — TAMSULOSIN HCL 0.4 MG PO CAPS: 0.4000 mg | ORAL_CAPSULE | Freq: Every day | ORAL | 0 refills | Status: DC

## 2021-10-31 MED ORDER — KETOROLAC TROMETHAMINE 60 MG/2ML IM SOLN: 60.0000 mg | Freq: Once | INTRAMUSCULAR | Status: DC

## 2021-10-31 MED ORDER — KETOROLAC TROMETHAMINE 15 MG/ML IJ SOLN
15.0000 mg | Freq: Once | INTRAMUSCULAR | Status: AC
  Administered 2021-10-31: 15 mg via INTRAVENOUS
  Filled 2021-10-31: qty 1

## 2021-10-31 NOTE — ED Provider Notes (Signed)
[facility] EMERGENCY DEPARTMENT Provider Note   CSN: [id number] Arrival date & time: 10/30/21  1636     History  Chief Complaint  Patient presents with   Flank Pain   Hematuria    Darrell Matthews is a 56 y.o. male.  The history is provided by the patient.  Flank Pain  Hematuria  Darrell Matthews is a 56 y.o. male who presents to the Emergency Department complaining of flank pain.  He presents to the ED complaining of left flank pain that has been waxing and waning for the last three months, abruptly worse today.  Pain is sharp.  Today had associated nausea. No vomiting.  He has dark/bloody urine every morning for the last three months.  No medical problems.  Has a hx/o kidney stones.  Has not needed surgery.  No alcohol or drugs.         Home Medications Prior to Admission medications   Medication Sig Start Date End Date Taking? Authorizing Provider  tamsulosin (FLOMAX) 0.4 MG CAPS capsule Take 1 capsule (0.4 mg total) by mouth daily. 10/31/21  Yes Quintella Reichert, MD  acetaminophen (TYLENOL) 500 MG tablet Take 1 tablet (500 mg total) by mouth every 6 (six) hours as needed. Patient taking differently: Take 1,000 mg by mouth every 6 (six) hours as needed for mild pain. 05/02/19   Fawze, Mina A, PA-C  Aspirin-Acetaminophen (GOODYS BODY PAIN PO) Take 1 packet by mouth daily as needed (pain).    [provider]  atorvastatin (LIPITOR) 10 MG tablet Take 1 tablet (10 mg total) by mouth daily. Patient not taking: No sig reported 10/30/18   Carmin Muskrat, MD  clopidogrel (PLAVIX) 75 MG tablet Take 1 tablet (75 mg total) by mouth daily. Patient not taking: No sig reported 10/30/18   Carmin Muskrat, MD  ibuprofen (ADVIL) 800 MG tablet Take 1 tablet (800 mg total) by mouth 3 (three) times daily. 07/09/21   Malvin Johns, MD  lidocaine (LIDODERM) 5 % Place 1 patch onto the skin daily. Remove & Discard patch within 12 hours or as directed by MD 08/12/20    Henderly, Britni A, PA-C      Allergies    Patient has no known allergies.    Review of Systems   Review of Systems  Genitourinary:  Positive for flank pain and hematuria.  All other systems reviewed and are negative.   Physical Exam Updated Vital Signs BP 128/78   Pulse 61   Temp 98 F (36.7 C) (Oral)   Resp 16   SpO2 97%  Physical Exam Vitals and nursing note reviewed.  Constitutional:      Appearance: He is well-developed.  HENT:     Head: Normocephalic and atraumatic.  Cardiovascular:     Rate and Rhythm: Normal rate and regular rhythm.     Heart sounds: No murmur heard. Pulmonary:     Effort: Pulmonary effort is normal. No respiratory distress.     Breath sounds: Normal breath sounds.  Abdominal:     Palpations: Abdomen is soft.     Tenderness: There is no abdominal tenderness. There is no guarding or rebound.  Musculoskeletal:        General: No tenderness.  Skin:    General: Skin is warm and dry.  Neurological:     Mental Status: He is alert and oriented to person, place, and time.  Psychiatric:        Behavior: Behavior normal.     ED  Results / Procedures / Treatments   Labs (all labs ordered are listed, but only abnormal results are displayed) Labs Reviewed  LIPASE, BLOOD - Abnormal; Notable for the following components:      Result Value   Lipase 77 (*)    All other components within normal limits  URINALYSIS, ROUTINE W REFLEX MICROSCOPIC - Abnormal; Notable for the following components:   Hgb urine dipstick LARGE (*)    RBC / HPF >50 (*)    All other components within normal limits  CBC WITH DIFFERENTIAL/PLATELET  COMPREHENSIVE METABOLIC PANEL    EKG None  Radiology CT Renal Stone Study  Result Date: 10/31/2021 CLINICAL DATA:  Flank pain. EXAM: CT ABDOMEN AND PELVIS WITHOUT CONTRAST TECHNIQUE: Multidetector CT imaging of the abdomen and pelvis was performed following the standard protocol without IV contrast. RADIATION DOSE REDUCTION:  This exam was performed according to the departmental dose-optimization program which includes automated exposure control, adjustment of the mA and/or kV according to patient size and/or use of iterative reconstruction technique. COMPARISON:  August 23, 2021 FINDINGS: Lower chest: No acute abnormality. Hepatobiliary: No focal liver abnormality is seen. No gallstones, gallbladder wall thickening, or biliary dilatation. Pancreas: Unremarkable. No pancreatic ductal dilatation or surrounding inflammatory changes. Spleen: Normal in size without focal abnormality. Adrenals/Urinary Tract: Adrenal glands are unremarkable. Kidneys are normal in size, without focal lesions. A 4 mm obstructing renal calculus is seen within the distal left ureter, with moderate severity left-sided hydronephrosis, hydroureter and perinephric inflammatory fat stranding. Bladder is unremarkable. Stomach/Bowel: Stomach is within normal limits. Appendix appears normal. No evidence of bowel wall thickening, distention, or inflammatory changes. Noninflamed diverticula are seen throughout the large bowel. Vascular/Lymphatic: Aortic atherosclerosis. No enlarged abdominal or pelvic lymph nodes. Reproductive: There is moderate severity calcification of a normal size prostate gland. Other: A 3.0 cm x 1.8 cm fat containing umbilical hernia is noted. No abdominopelvic ascites. Musculoskeletal: Moderate to marked severity degenerative changes are seen at the level of L5-S1. IMPRESSION: 1. 4 mm obstructing renal calculus within the distal left ureter. 2. Colonic diverticulosis. 3. Fat-containing umbilical hernia. 4. Moderate to marked severity degenerative changes at the level of L5-S1. 5. Aortic atherosclerosis. Electronically Signed   By: Virgina Norfolk M.D.   On: 10/31/2021 01:36    Procedures Procedures    Medications Ordered in ED Medications  ondansetron (ZOFRAN-ODT) disintegrating tablet 4 mg (4 mg Oral Given 10/30/21 1813)  ketorolac (TORADOL)  15 MG/ML injection 15 mg (15 mg Intravenous Given 10/31/21 0041)    ED Course/ Medical Decision Making/ A&P                           Medical Decision Making Risk Prescription drug management.   Patient with history of kidney stones here for evaluation of left flank pain.  He is nontoxic-appearing on evaluation and well-hydrated.  UA is not consistent with UTI.  BMP with normal renal function.  CT stone study was obtained, which demonstrates a 4 mm ureteral stone.  No complicating features.  He was treated with Toradol with improvement in his pain.  Discussed home care for kidney stone.  Discussed importance of urology follow-up given recurrent symptoms.  Also discussed incidental findings on imaging such as atherosclerosis, diverticulosis and degenerative changes.  Return precautions discussed.        Final Clinical Impression(s) / ED Diagnoses Final diagnoses:  Kidney stone  Aortic atherosclerosis ([place])    Rx / DC Orders ED Discharge  Orders          Ordered    tamsulosin (FLOMAX) 0.4 MG CAPS capsule  Daily        10/31/21 0155              Quintella Reichert, MD 10/31/21 [phone]

## 2021-10-31 NOTE — Discharge Instructions (Addendum)
You have a 4 mm stone in your left ureter.  Please call urology for follow-up.  Drink plenty of fluids.  Get reevaluated immediately if you have fevers, cannot pee or have uncontrolled pain.  You had a CT scan performed in the emergency department today.  That CT showed atherosclerosis or hardening of the arteries.  It is very important for you to stop smoking.  Please follow-up with your family doctor for further evaluation.

## 2021-11-02 ENCOUNTER — Encounter (HOSPITAL_BASED_OUTPATIENT_CLINIC_OR_DEPARTMENT_OTHER): Payer: Self-pay | Admitting: Urology

## 2021-11-02 ENCOUNTER — Other Ambulatory Visit: Payer: Self-pay

## 2021-11-02 ENCOUNTER — Other Ambulatory Visit: Payer: Self-pay | Admitting: Urology
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
## 2021-11-02 NOTE — Progress Notes (Signed)
Spoke w/ via phone for pre-op interview---[facility] needs dos----none               Lab results------10/30/21 cbc w/ diff & cmp in Epic, 06/10/2018 echocardiogram in Epic COVID test -----patient states asymptomatic no test needed Arrive at -------1015 on Wednesday, 11/03/2021 NPO after MN NO Solid Food.  Clear liquids from MN until---0915 Med rec completed Medications to take morning of surgery -----Flomax Diabetic medication -----n/a Patient instructed no nail polish to be worn day of surgery Patient instructed to bring photo id and insurance card day of surgery Patient aware to have Driver (ride ) / caregiver    for 24 hours after surgery - ex-wife [facility] Patient Special Instructions -----No smoking 24 hours before surgery. Pre-Op special Istructions -----none Patient verbalized understanding of instructions that were given at this phone interview. Patient denies shortness of breath, chest pain, fever, cough at this phone interview.

## 2021-11-03 ENCOUNTER — Ambulatory Visit (HOSPITAL_BASED_OUTPATIENT_CLINIC_OR_DEPARTMENT_OTHER): Payer: BC Managed Care – PPO | Admitting: Certified Registered Nurse Anesthetist

## 2021-11-03 ENCOUNTER — Encounter (HOSPITAL_BASED_OUTPATIENT_CLINIC_OR_DEPARTMENT_OTHER): Payer: Self-pay | Admitting: Urology

## 2021-11-03 ENCOUNTER — Ambulatory Visit (HOSPITAL_BASED_OUTPATIENT_CLINIC_OR_DEPARTMENT_OTHER)
Admission: RE | Admit: 2021-11-03 | Discharge: 2021-11-03 | Disposition: A | Discharge status: Home / Self Care | Payer: BC Managed Care – PPO | Attending: Urology | Admitting: Urology

## 2021-11-03 ENCOUNTER — Encounter (HOSPITAL_BASED_OUTPATIENT_CLINIC_OR_DEPARTMENT_OTHER)
Admission: RE | Disposition: A | Discharge status: Home / Self Care | Payer: Self-pay | Source: Home / Self Care | Attending: Urology

## 2021-11-03 DIAGNOSIS — F17210 Nicotine dependence, cigarettes, uncomplicated: Secondary | ICD-10-CM | POA: Insufficient documentation

## 2021-11-03 DIAGNOSIS — N135 Crossing vessel and stricture of ureter without hydronephrosis: Secondary | ICD-10-CM | POA: Diagnosis not present

## 2021-11-03 DIAGNOSIS — I350 Nonrheumatic aortic (valve) stenosis: Secondary | ICD-10-CM | POA: Diagnosis not present

## 2021-11-03 DIAGNOSIS — Z01818 Encounter for other preprocedural examination: Secondary | ICD-10-CM

## 2021-11-03 DIAGNOSIS — F419 Anxiety disorder, unspecified: Secondary | ICD-10-CM | POA: Diagnosis not present

## 2021-11-03 DIAGNOSIS — N201 Calculus of ureter: Secondary | ICD-10-CM | POA: Diagnosis not present

## 2021-11-03 HISTORY — DX: Other psychoactive substance abuse, uncomplicated: F19.10

## 2021-11-03 HISTORY — DX: Headache, unspecified: R51.9

## 2021-11-03 HISTORY — PX: CYSTOSCOPY/URETEROSCOPY/HOLMIUM LASER/STENT PLACEMENT: SHX6546

## 2021-11-03 HISTORY — DX: Personal history of urinary calculi: Z87.442

## 2021-11-03 SURGERY — CYSTOSCOPY/URETEROSCOPY/HOLMIUM LASER/STENT PLACEMENT
Anesthesia: General | Site: Ureter | Laterality: Left

## 2021-11-03 MED ORDER — CEFAZOLIN SODIUM-DEXTROSE 2-4 GM/100ML-% IV SOLN
INTRAVENOUS | Status: AC
  Filled 2021-11-03: qty 100

## 2021-11-03 MED ORDER — PROPOFOL 10 MG/ML IV BOLUS
INTRAVENOUS | Status: DC | PRN
  Administered 2021-11-03: 200 mg via INTRAVENOUS

## 2021-11-03 MED ORDER — OXYCODONE HCL 5 MG/5ML PO SOLN: 5.0000 mg | Freq: Once | ORAL | Status: DC | PRN

## 2021-11-03 MED ORDER — LIDOCAINE 2% (20 MG/ML) 5 ML SYRINGE
INTRAMUSCULAR | Status: DC | PRN
  Administered 2021-11-03: 60 mg via INTRAVENOUS

## 2021-11-03 MED ORDER — DEXMEDETOMIDINE HCL IN NACL 80 MCG/20ML IV SOLN
INTRAVENOUS | Status: DC | PRN
  Administered 2021-11-03: 8 ug via BUCCAL

## 2021-11-03 MED ORDER — IOHEXOL 300 MG/ML  SOLN
INTRAMUSCULAR | Status: DC | PRN
  Administered 2021-11-03: 1 mL

## 2021-11-03 MED ORDER — DEXAMETHASONE SODIUM PHOSPHATE 10 MG/ML IJ SOLN
INTRAMUSCULAR | Status: DC | PRN
  Administered 2021-11-03: 10 mg via INTRAVENOUS

## 2021-11-03 MED ORDER — CEFAZOLIN SODIUM-DEXTROSE 2-4 GM/100ML-% IV SOLN
2.0000 g | INTRAVENOUS | Status: AC
  Administered 2021-11-03: 2 g via INTRAVENOUS

## 2021-11-03 MED ORDER — FENTANYL CITRATE (PF) 100 MCG/2ML IJ SOLN: 25.0000 ug | INTRAMUSCULAR | Status: DC | PRN

## 2021-11-03 MED ORDER — MIDAZOLAM HCL 2 MG/2ML IJ SOLN
INTRAMUSCULAR | Status: DC | PRN
  Administered 2021-11-03: 2 mg via INTRAVENOUS

## 2021-11-03 MED ORDER — EPHEDRINE SULFATE (PRESSORS) 50 MG/ML IJ SOLN
INTRAMUSCULAR | Status: DC | PRN
  Administered 2021-11-03: 10 mg via INTRAVENOUS
  Administered 2021-11-03 (×2): 5 mg via INTRAVENOUS

## 2021-11-03 MED ORDER — ACETAMINOPHEN 500 MG PO TABS
1000.0000 mg | ORAL_TABLET | Freq: Once | ORAL | Status: AC
  Administered 2021-11-03: 1000 mg via ORAL

## 2021-11-03 MED ORDER — FENTANYL CITRATE (PF) 250 MCG/5ML IJ SOLN
INTRAMUSCULAR | Status: DC | PRN
  Administered 2021-11-03: 50 ug via INTRAVENOUS

## 2021-11-03 MED ORDER — SODIUM CHLORIDE 0.9 % IR SOLN
Status: DC | PRN
  Administered 2021-11-03: 3000 mL via INTRAVESICAL

## 2021-11-03 MED ORDER — SODIUM CHLORIDE 0.9% FLUSH: 3.0000 mL | Freq: Two times a day (BID) | INTRAVENOUS | Status: DC

## 2021-11-03 MED ORDER — ONDANSETRON HCL 4 MG/2ML IJ SOLN
INTRAMUSCULAR | Status: DC | PRN
  Administered 2021-11-03: 4 mg via INTRAVENOUS

## 2021-11-03 MED ORDER — PROMETHAZINE HCL 25 MG/ML IJ SOLN: 6.2500 mg | INTRAMUSCULAR | Status: DC | PRN

## 2021-11-03 MED ORDER — LACTATED RINGERS IV SOLN: INTRAVENOUS | Status: DC

## 2021-11-03 MED ORDER — TRAMADOL HCL 50 MG PO TABS: 50.0000 mg | ORAL_TABLET | Freq: Four times a day (QID) | ORAL | 0 refills | Status: DC | PRN

## 2021-11-03 MED ORDER — AMISULPRIDE (ANTIEMETIC) 5 MG/2ML IV SOLN: 10.0000 mg | Freq: Once | INTRAVENOUS | Status: DC | PRN

## 2021-11-03 MED ORDER — PROPOFOL 10 MG/ML IV BOLUS
INTRAVENOUS | Status: AC
  Filled 2021-11-03: qty 20

## 2021-11-03 MED ORDER — PHENYLEPHRINE 80 MCG/ML (10ML) SYRINGE FOR IV PUSH (FOR BLOOD PRESSURE SUPPORT)
PREFILLED_SYRINGE | INTRAVENOUS | Status: DC | PRN
  Administered 2021-11-03 (×3): 80 ug via INTRAVENOUS

## 2021-11-03 MED ORDER — FENTANYL CITRATE (PF) 100 MCG/2ML IJ SOLN
INTRAMUSCULAR | Status: AC
  Filled 2021-11-03: qty 2

## 2021-11-03 MED ORDER — ACETAMINOPHEN 500 MG PO TABS
ORAL_TABLET | ORAL | Status: AC
  Filled 2021-11-03: qty 2

## 2021-11-03 MED ORDER — KETOROLAC TROMETHAMINE 30 MG/ML IJ SOLN: 30.0000 mg | Freq: Once | INTRAMUSCULAR | Status: DC | PRN

## 2021-11-03 MED ORDER — OXYCODONE HCL 5 MG PO TABS: 5.0000 mg | ORAL_TABLET | Freq: Once | ORAL | Status: DC | PRN

## 2021-11-03 MED ORDER — MIDAZOLAM HCL 2 MG/2ML IJ SOLN
INTRAMUSCULAR | Status: AC
  Filled 2021-11-03: qty 2

## 2021-11-03 SURGICAL SUPPLY — 28 items
BAG DRAIN URO-CYSTO SKYTR STRL (DRAIN) ×1 IMPLANT
BAG DRN UROCATH (DRAIN) ×1
BASKET STONE 1.7 NGAGE (UROLOGICAL SUPPLIES) IMPLANT
BASKET ZERO TIP NITINOL 2.4FR (BASKET) IMPLANT
BSKT STON RTRVL ZERO TP 2.4FR (BASKET)
CATH URET 5FR 28IN CONE TIP (BALLOONS) ×1
CATH URET 5FR 70CM CONE TIP (BALLOONS) IMPLANT
CATH URETL OPEN 5X70 (CATHETERS) IMPLANT
CLOTH BEACON ORANGE TIMEOUT ST (SAFETY) ×1 IMPLANT
DRSG TEGADERM 2-3/8X2-3/4 SM (GAUZE/BANDAGES/DRESSINGS) IMPLANT
ELECT REM PT RETURN 9FT ADLT (ELECTROSURGICAL)
ELECTRODE REM PT RTRN 9FT ADLT (ELECTROSURGICAL) IMPLANT
FIBER LASER FLEXIVA 365 (UROLOGICAL SUPPLIES) IMPLANT
GLOVE SURG SS PI 8.0 STRL IVOR (GLOVE) ×1 IMPLANT
GOWN STRL REUS W/TWL XL LVL3 (GOWN DISPOSABLE) ×1 IMPLANT
GUIDEWIRE ANG ZIPWIRE 038X150 (WIRE) IMPLANT
GUIDEWIRE STR DUAL SENSOR (WIRE) ×1 IMPLANT
INFUSOR MANOMETER BAG 3000ML (MISCELLANEOUS) IMPLANT
IV NS IRRIG 3000ML ARTHROMATIC (IV SOLUTION) ×1 IMPLANT
KIT TURNOVER CYSTO (KITS) ×1 IMPLANT
MANIFOLD NEPTUNE II (INSTRUMENTS) ×1 IMPLANT
NS IRRIG 500ML POUR BTL (IV SOLUTION) ×1 IMPLANT
PACK CYSTO (CUSTOM PROCEDURE TRAY) ×1 IMPLANT
STENT URET 6FRX26 CONTOUR (STENTS) IMPLANT
TRACTIP FLEXIVA PULS ID 200XHI (Laser) IMPLANT
TRACTIP FLEXIVA PULSE ID 200 (Laser) ×1
TUBE CONNECTING 12X1/4 (SUCTIONS) ×1 IMPLANT
TUBING UROLOGY SET (TUBING) IMPLANT

## 2021-11-03 NOTE — Discharge Instructions (Addendum)
You may remove the stent on Friday morning by pulling the attached string.  If you don't feel you can do that, please call the office.  Please bring the stone fragments to the office.      Post Anesthesia Home Care Instructions  Activity: Get plenty of rest for the remainder of the day. A responsible individual must stay with you for 24 hours following the procedure.  For the next 24 hours, DO NOT: -Drive a car -Paediatric nurse -Drink alcoholic beverages -Take any medication unless instructed by your physician -Make any legal decisions or sign important papers.  Meals: Start with liquid foods such as gelatin or soup. Progress to regular foods as tolerated. Avoid greasy, spicy, heavy foods. If nausea and/or vomiting occur, drink only clear liquids until the nausea and/or vomiting subsides. Call your physician if vomiting continues.  Special Instructions/Symptoms: Your throat may feel dry or sore from the anesthesia or the breathing tube placed in your throat during surgery. If this causes discomfort, gargle with warm salt water. The discomfort should disappear within 24 hours.

## 2021-11-03 NOTE — Transfer of Care (Signed)
Immediate Anesthesia Transfer of Care Note  Patient: Darrell Matthews [facility]  Procedure(s) Performed: CYSTOSCOPY LEFT URETEROSCOPY/HOLMIUM LASER/STENT PLACEMENT (Left: Ureter)  Patient Location: PACU  Anesthesia Type:General  Level of Consciousness: awake, alert  and oriented  Airway & Oxygen Therapy: Patient Spontanous Breathing  Post-op Assessment: Report given to RN and Post -op Vital signs reviewed and stable  Post vital signs: Reviewed and stable  Last Vitals:  Vitals Value Taken Time  BP    Temp    Pulse 83 11/03/21 1323  Resp 15 11/03/21 1323  SpO2 95 % 11/03/21 1323  Vitals shown include unvalidated device data.  Last Pain:  Vitals:   11/03/21 1027  TempSrc: Oral  PainSc: 5       Patients Stated Pain Goal: 5 (22/33/61 2244)  Complications: No notable events documented.

## 2021-11-03 NOTE — Interval H&P Note (Signed)
History and Physical Interval Note:  He continues to have pain.   11/03/2021 12:14 PM  Darrell Matthews  has presented today for surgery, with the diagnosis of LEFT DISTAL STONE.  The various methods of treatment have been discussed with the patient and family. After consideration of risks, benefits and other options for treatment, the patient has consented to  Procedure(s) with comments: CYSTOSCOPY LEFT URETEROSCOPY/HOLMIUM LASER/STENT PLACEMENT (Left) - 1 HR FOR CASE as a surgical intervention.  The patient's history has been reviewed, patient examined, no change in status, stable for surgery.  I have reviewed the patient's chart and labs.  Questions were answered to the patient's satisfaction.     Irine Seal

## 2021-11-03 NOTE — Anesthesia Preprocedure Evaluation (Addendum)
Anesthesia Evaluation  Patient identified by MRN, date of birth, ID band Patient awake    Reviewed: Allergy & Precautions, NPO status , Patient's Chart, lab work & pertinent test results  Airway Mallampati: II  TM Distance: >3 FB Neck ROM: Full    Dental  (+) Poor Dentition, Chipped, Missing   Pulmonary Current Smoker and Patient abstained from smoking.,    Pulmonary exam normal breath sounds clear to auscultation       Cardiovascular Normal cardiovascular exam+ Valvular Problems/Murmurs AS  Rhythm:Regular Rate:Normal     Neuro/Psych  Headaches, Anxiety    GI/Hepatic negative GI ROS, (+)     substance abuse  ,   Endo/Other  negative endocrine ROS  Renal/GU negative Renal ROS     Musculoskeletal negative musculoskeletal ROS (+)   Abdominal   Peds  Hematology negative hematology ROS (+)   Anesthesia Other Findings LEFT DISTAL STONE  Reproductive/Obstetrics                            Anesthesia Physical Anesthesia Plan  ASA: 3  Anesthesia Plan: General   Post-op Pain Management:    Induction: Intravenous  PONV Risk Score and Plan: Ondansetron, Dexamethasone, Midazolam and Treatment may vary due to age or medical condition  Airway Management Planned: LMA  Additional Equipment:   Intra-op Plan:   Post-operative Plan: Extubation in OR  Informed Consent: I have reviewed the patients History and Physical, chart, labs and discussed the procedure including the risks, benefits and alternatives for the proposed anesthesia with the patient or authorized representative who has indicated his/her understanding and acceptance.     Dental advisory given  Plan Discussed with: CRNA  Anesthesia Plan Comments:        Anesthesia Quick Evaluation

## 2021-11-03 NOTE — Op Note (Signed)
Procedure: 1.  Cystoscopy with left retrograde pyelogram and interpretation. 2.  Left ureteroscopy with holmium laser application, stone extraction and insertion of left double-J stent with tether. 3.  Application of fluoroscopy.  Preop diagnosis: Left distal ureteral stone.  Postop diagnosis: Left mid ureteral stone.  Surgeon: Dr. Irine Seal.  Anesthesia: General.  Specimen: Stone fragments.  Drain: 6 [place] by 26 cm left contour double-J stent with tether.  EBL: None.  Complications: None.  Indications: The patient is a 55 year old male who has been dealing with a 4 mm left ureteral stone since June and on the most recent scan it had progressed but only to the upper distal ureter.  After reviewing the options he is elected proceed with ureteroscopy.  Procedure: He was given 2 g of Ancef.  He was taken operating room where he was given a general anesthetic.  He was placed in lithotomy position and fitted with PAS hose.  His perineum and genitalia were prepped with Betadine solution he was draped in usual sterile fashion.  Cystoscopy was performed using the 21 [place] scope and 30 degree lens.  Examination revealed a normal urethra.  The prostatic urethra short without significant obstruction.  Examination of bladder demonstrated normal mucosa with mild trabeculation.  Ureteral orifice ease were unremarkable.  The left ureteral orifice was cannulated with a 5 [place] open-ended catheter and contrast was instilled.  The left retrograde pyelogram demonstrated a fairly tight intramural ureter with a filling defect in the mid ureter consistent with a stone with mild proximal dilation.  A sensor wire was advanced through the ureteral catheter into the distal ureter.  Initially I did not past the stone and double back on itself but with further manipulation I was able to get by and into the collecting system.  The open-ended catheter and the cystoscope were then removed.  The single-lumen 4.5  French semirigid short ureteroscope was then advanced per urethra and alongside the wire up to the level of the stone.  There was mild narrowing of the very distal ureter but the scope passed without difficulty.  Once the stone was visualized a 200 m laser fiber was used to fragment the stone with the settings of 0.3 j and 30 Hz on the left pedal and 1 J and 10 Hz on the right pedal.  Once the stone was broken into manageable fragments, an engage basket was used to remove the residual pieces to the bladder.  Once the stone fragments had all been removed and final inspection only revealed a little bit of grit and no significant mucosal injury, the ureteroscope was removed and the cystoscope was replaced over the wire.  A 6 French by 26 cm contour double-J stent with tether was then advanced the kidney under fluoroscopic guidance.  The wire was removed, leaving a good coil in the kidney and a good coil in the bladder.  The bladder was then drained and the cystoscope was removed leaving the stent string exiting the urethra.  The cystoscope was reinserted and the stone fragments were removed.  Cystoscope was then removed.  The stent string was secured to the patient's penis.  He was taken down from lithotomy position, the anesthetic was reversed and he was moved recovery in stable condition.  There were no complications.  He will be given the stone fragments to bring to the office for analysis.

## 2021-11-03 NOTE — Anesthesia Procedure Notes (Signed)
Procedure Name: LMA Insertion Date/Time: 11/03/2021 12:39 PM  Performed by: Clearnce Sorrel, CRNAPre-anesthesia Checklist: Patient identified, Emergency Drugs available, Suction available and Patient being monitored Patient Re-evaluated:Patient Re-evaluated prior to induction Oxygen Delivery Method: Circle System Utilized Preoxygenation: Pre-oxygenation with 100% oxygen Induction Type: IV induction Ventilation: Mask ventilation without difficulty LMA: LMA inserted LMA Size: 4.0 Number of attempts: 1 Airway Equipment and Method: Bite block Placement Confirmation: positive ETCO2 Tube secured with: Tape Dental Injury: Teeth and Oropharynx as per pre-operative assessment

## 2021-11-03 NOTE — Anesthesia Postprocedure Evaluation (Signed)
Anesthesia Post Note  Patient: Darrell Matthews  Procedure(s) Performed: CYSTOSCOPY LEFT URETEROSCOPY/HOLMIUM LASER/STENT PLACEMENT (Left: Ureter)     Patient location during evaluation: PACU Anesthesia Type: General Level of consciousness: awake Pain management: pain level controlled Vital Signs Assessment: post-procedure vital signs reviewed and stable Respiratory status: spontaneous breathing, nonlabored ventilation, respiratory function stable and patient connected to nasal cannula oxygen Cardiovascular status: blood pressure returned to baseline and stable Postop Assessment: no apparent nausea or vomiting Anesthetic complications: no   No notable events documented.  Last Vitals:  Vitals:   11/03/21 1400 11/03/21 1420  BP: (!) 149/83 (!) 145/83  Pulse: 72 84  Resp: 15 16  Temp:    SpO2: 96% 94%    Last Pain:  Vitals:   11/03/21 1420  TempSrc:   PainSc: 0-No pain                 Zyan Mirkin P Rashmi Tallent

## 2021-11-04 ENCOUNTER — Encounter (HOSPITAL_BASED_OUTPATIENT_CLINIC_OR_DEPARTMENT_OTHER): Payer: Self-pay | Admitting: Urology

## 2022-07-28 DIAGNOSIS — Z23 Encounter for immunization: Secondary | ICD-10-CM | POA: Diagnosis not present

## 2022-07-28 DIAGNOSIS — S61001A Unspecified open wound of right thumb without damage to nail, initial encounter: Secondary | ICD-10-CM | POA: Diagnosis not present

## 2022-08-03 ENCOUNTER — Emergency Department (HOSPITAL_BASED_OUTPATIENT_CLINIC_OR_DEPARTMENT_OTHER)
Admission: EM | Admit: 2022-08-03 | Discharge: 2022-08-03 | Disposition: A | Discharge status: Home / Self Care | Payer: BC Managed Care – PPO | Attending: Emergency Medicine | Admitting: Emergency Medicine

## 2022-08-03 ENCOUNTER — Emergency Department (HOSPITAL_BASED_OUTPATIENT_CLINIC_OR_DEPARTMENT_OTHER): Payer: BC Managed Care – PPO

## 2022-08-03 ENCOUNTER — Other Ambulatory Visit: Payer: Self-pay

## 2022-08-03 DIAGNOSIS — R519 Headache, unspecified: Secondary | ICD-10-CM | POA: Diagnosis not present

## 2022-08-03 DIAGNOSIS — R202 Paresthesia of skin: Secondary | ICD-10-CM | POA: Diagnosis not present

## 2022-08-03 MED ORDER — DEXAMETHASONE SODIUM PHOSPHATE 10 MG/ML IJ SOLN
10.0000 mg | Freq: Once | INTRAMUSCULAR | Status: AC
  Administered 2022-08-03: 10 mg via INTRAMUSCULAR
  Filled 2022-08-03: qty 1

## 2022-08-03 MED ORDER — ACETAMINOPHEN 500 MG PO TABS
1000.0000 mg | ORAL_TABLET | Freq: Once | ORAL | Status: AC
  Administered 2022-08-03: 1000 mg via ORAL
  Filled 2022-08-03: qty 2

## 2022-08-03 NOTE — ED Triage Notes (Signed)
Patient presents to ED via POV from home. Here with headache x 3 days. Reports pain to right top of head and numbness upon palpation. Reports "last night I could hear my heartbeat in my head". Denies changes in vision at this time. Reports intermittent episode of blurred vision 2 days ago. No slurred speech. Steady gait.

## 2022-08-03 NOTE — Discharge Instructions (Signed)
Continue Tylenol 1000 mg every 6 hours as needed for pain.  Continue ibuprofen 800 mg every 8 hours as needed for pain.

## 2022-08-03 NOTE — ED Provider Notes (Signed)
[facility] Provider Note   CSN: [id number] Arrival date & time: 08/03/22  1323     History  Chief Complaint  Patient presents with   Headache    Darrell Matthews is a 57 y.o. male.  Patient here for evaluation of headaches.  He has been having some shooting pain over the right side of his head last few days.  He will have short bursts of tingling type discomfort that then gets better.  He has not been sleeping well.  He denies any vision loss or weakness or numbness or tingling otherwise.  Denies any trauma.  Denies any speech issues.  Denies any issues walking.  No history of headaches.  Denies any chest pain, shortness of breath.  The history is provided by the patient.       Home Medications Prior to Admission medications   Medication Sig Start Date End Date Taking? Authorizing Provider  acetaminophen (TYLENOL) 500 MG tablet Take 1 tablet (500 mg total) by mouth every 6 (six) hours as needed. Patient taking differently: Take 1,000 mg by mouth every 6 (six) hours as needed for mild pain. 05/02/19   Fawze, Mina A, PA-C  ibuprofen (ADVIL) 800 MG tablet Take 1 tablet (800 mg total) by mouth 3 (three) times daily. Patient taking differently: Take 800 mg by mouth as needed. 07/09/21   Rolan Bucco, MD  tamsulosin (FLOMAX) 0.4 MG CAPS capsule Take 1 capsule (0.4 mg total) by mouth daily. 10/31/21   Tilden Fossa, MD  traMADol (ULTRAM) 50 MG tablet Take 1 tablet (50 mg total) by mouth every 6 (six) hours as needed. 11/03/21 11/03/22  Bjorn Pippin, MD      Allergies    Patient has no known allergies.    Review of Systems   Review of Systems  Physical Exam Updated Vital Signs BP 127/79   Pulse 62   Temp 98 F (36.7 C)   Resp 16   Ht 5\' 11"  (1.803 m)   Wt 77.1 kg   SpO2 96%   BMI 23.71 kg/m  Physical Exam Vitals and nursing note reviewed.  Constitutional:      General: He is not in acute distress.    Appearance: He is  well-developed.  HENT:     Head: Normocephalic and atraumatic.     Mouth/Throat:     Mouth: Mucous membranes are moist.  Eyes:     Extraocular Movements: Extraocular movements intact.     Right eye: Normal extraocular motion and no nystagmus.     Left eye: Normal extraocular motion and no nystagmus.     Conjunctiva/sclera: Conjunctivae normal.     Pupils: Pupils are equal, round, and reactive to light.  Cardiovascular:     Rate and Rhythm: Normal rate and regular rhythm.     Heart sounds: No murmur heard. Pulmonary:     Effort: Pulmonary effort is normal. No respiratory distress.     Breath sounds: Normal breath sounds.  Abdominal:     Palpations: Abdomen is soft.     Tenderness: There is no abdominal tenderness.  Musculoskeletal:        General: No swelling. Normal range of motion.     Cervical back: Normal range of motion and neck supple.  Skin:    General: Skin is warm and dry.     Capillary Refill: Capillary refill takes less than 2 seconds.  Neurological:     Mental Status: He is alert and oriented to  person, place, and time.     Comments: 5+ out of 5 strength throughout, normal sensation, no drift, normal finger-nose-finger, normal speech  Psychiatric:        Mood and Affect: Mood normal.     ED Results / Procedures / Treatments   Labs (all labs ordered are listed, but only abnormal results are displayed) Labs Reviewed - No data to display  EKG None  Radiology CT Head Wo Contrast  Result Date: 08/03/2022 CLINICAL DATA:  Headache, increasing frequency or severity EXAM: CT HEAD WITHOUT CONTRAST TECHNIQUE: Contiguous axial images were obtained from the base of the skull through the vertex without intravenous contrast. RADIATION DOSE REDUCTION: This exam was performed according to the departmental dose-optimization program which includes automated exposure control, adjustment of the mA and/or kV according to patient size and/or use of iterative reconstruction technique.  COMPARISON:  CT head 10/30/18 FINDINGS: Brain: No evidence of acute infarction, hemorrhage, hydrocephalus, extra-axial collection or mass lesion/mass effect. Vascular: No hyperdense vessel or unexpected calcification. Skull: Normal. Negative for fracture or focal lesion. Sinuses/Orbits: No middle ear or mastoid effusion. Paranasal sinuses clear. Orbits are unremarkable Other: None. IMPRESSION: No acute intracranial abnormality. No CT etiology for headaches identified Electronically Signed   By: Lorenza Cambridge M.D.   On: 08/03/2022 14:04    Procedures Procedures    Medications Ordered in ED Medications  dexamethasone (DECADRON) injection 10 mg (has no administration in time range)  acetaminophen (TYLENOL) tablet 1,000 mg (1,000 mg Oral Given 08/03/22 1343)    ED Course/ Medical Decision Making/ A&P                             Medical Decision Making Amount and/or Complexity of Data Reviewed Radiology: ordered.  Risk OTC drugs.   Darrell Matthews is here with headache.  No significant medical history.  Normal vitals.  No fever.  Differential diagnosis likely migraine/cluster type headache, seems less likely but could be a trigeminal neuralgia given how he describes the discomfort.  Atypical for him to have headaches and will get a head CT to make sure there is no acute abnormality.  Have no concern for stroke.  His neurologic exam is normal.  Will give him Tylenol, Decadron.  He has no chest pain or shortness of breath and I have no concern for other acute process at this time.  Concern for meningitis or infectious process.  Overall head CT is unremarkable per radiology report.  Will give him a dose of Decadron.  Discharged in good condition.  This chart was dictated using voice recognition software.  Despite best efforts to proofread,  errors can occur which can change the documentation meaning.         Final Clinical Impression(s) / ED Diagnoses Final diagnoses:  Bad headache     Rx / DC Orders ED Discharge Orders     None         Virgina Norfolk, DO 08/03/22 1414

## 2022-09-01 ENCOUNTER — Encounter: Payer: Self-pay | Admitting: Family

## 2022-09-01 ENCOUNTER — Ambulatory Visit (INDEPENDENT_AMBULATORY_CARE_PROVIDER_SITE_OTHER): Payer: BC Managed Care – PPO | Admitting: Family

## 2022-09-01 VITALS — BP 107/70 | HR 66 | Temp 97.8°F | Ht 71.0 in | Wt 163.0 lb

## 2022-09-01 DIAGNOSIS — E782 Mixed hyperlipidemia: Secondary | ICD-10-CM

## 2022-09-01 DIAGNOSIS — F17218 Nicotine dependence, cigarettes, with other nicotine-induced disorders: Secondary | ICD-10-CM

## 2022-09-01 DIAGNOSIS — Z1211 Encounter for screening for malignant neoplasm of colon: Secondary | ICD-10-CM

## 2022-09-01 DIAGNOSIS — R42 Dizziness and giddiness: Secondary | ICD-10-CM

## 2022-09-01 MED ORDER — VARENICLINE TARTRATE 0.5 MG PO TABS
0.5000 mg | ORAL_TABLET | ORAL | 2 refills | Status: DC
Start: 2022-09-01 — End: 2022-09-22

## 2022-09-01 NOTE — Patient Instructions (Signed)
Welcome to [facility], It was a pleasure meeting you today!    As discussed, I have sent generic Chantix  to your pharmacy to help you stop smoking. You will have less side effects if you can not smoke while taking the medication. Continue taking for at least 1 month after you stop to ensure continued cessation.  I have sent a referral to our gastroenterology office regarding a Colonoscopy.  Please schedule a physical with fasting labs at your convenience today.    PLEASE NOTE: If you had any LAB tests please let us know if you have not heard back within a few days. You may see your results on MyChart before we have a chance to review them but we will give you a call once they are reviewed by [place]. If we ordered any REFERRALS today, please let us know if you have not heard from their office within the next week.  Let us know through MyChart if you are needing REFILLS, or have your pharmacy send [place] the request. You can also use MyChart to communicate with me or any office staff.  Please try these tips to maintain a healthy lifestyle: It is important that you exercise regularly at least 30 minutes 5 times a week. Think about what you will eat, plan ahead. Choose whole foods, & think  "clean, green, fresh or frozen" over canned, processed or packaged foods which are more sugary, salty, and fatty. 70 to 75% of food eaten should be fresh vegetables and protein. 2-3  meals daily with healthy snacks between meals, but must be whole fruit, protein or vegetables. Aim to eat over a 10 hour period when you are active, for example, 7am to 5pm, and then STOP after your last meal of the day, drinking only water.  Shorter eating windows, 6-8 hours, are showing benefits in heart disease and blood sugar regulation. Drink water every day! Shoot for 64 ounces daily = 8 cups, no other drink is as healthy! Fruit juice is best enjoyed in a healthy way, by EATING the fruit.
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
## 2022-09-13 DIAGNOSIS — F17200 Nicotine dependence, unspecified, uncomplicated: Secondary | ICD-10-CM | POA: Insufficient documentation

## 2022-09-14 DIAGNOSIS — E782 Mixed hyperlipidemia: Secondary | ICD-10-CM | POA: Insufficient documentation

## 2022-09-14 NOTE — Assessment & Plan Note (Signed)
chronic no meds, used to be on Lipitor exercises, not always healthy diet, smokes advised pt to return for physical with fasting labs f/u 1-32m

## 2022-09-14 NOTE — Assessment & Plan Note (Addendum)
chronic 1 pack ppd for years but had been 2ppd prior to that, total yrs = 30 Tried nicotine patches in past but due to sweating kept coming off Has not tried the gum or lozenges.  discussed tx options sending generic Chantix 0.5mg  with titration instructions, advised on SE tips for success handout provided f/u around 1-2m

## 2022-09-21 ENCOUNTER — Encounter: Payer: BC Managed Care – PPO | Admitting: Family

## 2022-09-22 ENCOUNTER — Encounter: Payer: Self-pay | Admitting: Family

## 2022-09-22 ENCOUNTER — Ambulatory Visit (INDEPENDENT_AMBULATORY_CARE_PROVIDER_SITE_OTHER): Payer: BC Managed Care – PPO | Admitting: Family

## 2022-09-22 VITALS — BP 118/79 | HR 74 | Temp 97.8°F | Ht 71.0 in | Wt 162.1 lb

## 2022-09-22 DIAGNOSIS — Z1159 Encounter for screening for other viral diseases: Secondary | ICD-10-CM | POA: Diagnosis not present

## 2022-09-22 DIAGNOSIS — F17218 Nicotine dependence, cigarettes, with other nicotine-induced disorders: Secondary | ICD-10-CM

## 2022-09-22 DIAGNOSIS — E782 Mixed hyperlipidemia: Secondary | ICD-10-CM

## 2022-09-22 DIAGNOSIS — Z0000 Encounter for general adult medical examination without abnormal findings: Secondary | ICD-10-CM | POA: Diagnosis not present

## 2022-09-22 DIAGNOSIS — Z122 Encounter for screening for malignant neoplasm of respiratory organs: Secondary | ICD-10-CM

## 2022-09-22 LAB — COMPREHENSIVE METABOLIC PANEL
ALT: 12 U/L (ref 0–53)
AST: 11 U/L (ref 0–37)
Albumin: 4.3 g/dL (ref 3.5–5.2)
Alkaline Phosphatase: 59 U/L (ref 39–117)
BUN: 24 mg/dL — ABNORMAL HIGH (ref 6–23)
CO2: 30 meq/L (ref 19–32)
Calcium: 10 mg/dL (ref 8.4–10.5)
Chloride: 103 meq/L (ref 96–112)
Creatinine, Ser: 1.05 mg/dL (ref 0.40–1.50)
GFR: 78.33 mL/min (ref >60.00)
Glucose, Bld: 90 mg/dL (ref 70–99)
Potassium: 4.9 meq/L (ref 3.5–5.1)
Sodium: 139 meq/L (ref 135–145)
Total Bilirubin: 0.7 mg/dL (ref 0.2–1.2)
Total Protein: 7.8 g/dL (ref 6.0–8.3)

## 2022-09-22 LAB — CBC WITH DIFFERENTIAL/PLATELET
Basophils Absolute: 0.1 10*3/uL (ref 0.0–0.1)
Basophils Relative: 0.6 % (ref 0.0–3.0)
Eosinophils Absolute: 0.1 10*3/uL (ref 0.0–0.7)
Eosinophils Relative: 0.6 % (ref 0.0–5.0)
HCT: 47.5 % (ref 39.0–52.0)
Hemoglobin: 15.9 g/dL (ref 13.0–17.0)
Lymphocytes Relative: 16.8 % (ref 12.0–46.0)
Lymphs Abs: 1.6 10*3/uL (ref 0.7–4.0)
MCHC: 33.5 g/dL (ref 30.0–36.0)
MCV: 93.5 fl (ref 78.0–100.0)
Monocytes Absolute: 0.6 10*3/uL (ref 0.1–1.0)
Monocytes Relative: 6 % (ref 3.0–12.0)
Neutro Abs: 7.1 10*3/uL (ref 1.4–7.7)
Neutrophils Relative %: 76 % (ref 43.0–77.0)
Platelets: 170 10*3/uL (ref 150.0–400.0)
RBC: 5.08 Mil/uL (ref 4.22–5.81)
RDW: 14.5 % (ref 11.5–15.5)
WBC: 9.3 10*3/uL (ref 4.0–10.5)

## 2022-09-22 LAB — LIPID PANEL
Cholesterol: 267 mg/dL — ABNORMAL HIGH (ref 0–200)
HDL: 38.9 mg/dL — ABNORMAL LOW (ref >39.00)
LDL Cholesterol: 204 mg/dL — ABNORMAL HIGH (ref 0–99)
NonHDL: 228.58
Total CHOL/HDL Ratio: 7
Triglycerides: 124 mg/dL (ref 0.0–149.0)
VLDL: 24.8 mg/dL (ref 0.0–40.0)

## 2022-09-22 MED ORDER — VARENICLINE TARTRATE 1 MG PO TABS
1.0000 mg | ORAL_TABLET | ORAL | 2 refills | Status: AC
Start: 2022-09-22 — End: ?

## 2022-09-22 NOTE — Assessment & Plan Note (Signed)
chronic no meds, used to be on Lipitor exercises, not always healthy diet, smokes checking lipids today f/u 27m-48yr

## 2022-09-22 NOTE — Patient Instructions (Addendum)
It was very nice to see you today!     I will review your lab results via MyChart in a few days.  Have a great rest of the summer!!      PLEASE NOTE:  If you had any lab tests please let us know if you have not heard back within a few days. You may see your results on MyChart before we have a chance to review them but we will give you a call once they are reviewed by [place]. If we ordered any referrals today, please let us know if you have not heard from their office within the next week.

## 2022-09-22 NOTE — Assessment & Plan Note (Signed)
chronic 1 pack ppd for years but had been 2ppd prior to that, total yrs = 30 sent generic Chantix 0.5mg  last visit pt reports he felt no affects, did not increase dose to max advised to titrate up to 4 pills per day f/u prn
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
## 2022-09-23 LAB — TSH: TSH: 1.74 u[IU]/mL (ref 0.35–5.50)

## 2022-09-23 LAB — HEPATITIS C ANTIBODY: Hepatitis C Ab: NONREACTIVE

## 2022-09-25 NOTE — Progress Notes (Signed)
Labs all look good except for cholesterol.  Your total cholesterol number & LDL (bad #) are high.  HDL (good#) is low. Want to restart statin - want to send generic Crestor to pharmacy. Ask Darrell Matthews if he is ok with this, and let me know.  If yes, we need to recheck fasting lipids in 3 months. I will start him out 3d/week, then every other day, then every day. Need to reduce any fried foods, alcohol, fatty meat (red meat), high fat dairy foods:  including cheese, milk, ice cream, also nonnutritional snacks e.g. chips/cookies,pies, cakes and candies from your diet. Increase intake of whole fruits/vegetables/fiber.   Continue or restart an exercise routine, shooting for 5-7days per week.

## 2022-09-28 ENCOUNTER — Encounter: Payer: Self-pay | Admitting: Family

## 2022-09-28 MED ORDER — ROSUVASTATIN CALCIUM 10 MG PO TABS
5.0000 mg | ORAL_TABLET | ORAL | 1 refills | Status: DC
Start: 2022-09-28 — End: 2023-09-04

## 2022-09-28 NOTE — Progress Notes (Signed)
Ok, thxMicah Matthews ahead and sent in [place], maybe pharmacy can reach him.

## 2022-09-28 NOTE — Addendum Note (Signed)
Addended byDulce Sellar on: 09/28/2022 10:33 PM   Modules accepted: Orders

## 2022-09-30 ENCOUNTER — Ambulatory Visit: Payer: BC Managed Care – PPO | Admitting: Family

## 2022-10-04 ENCOUNTER — Encounter: Payer: Self-pay | Admitting: Family

## 2022-10-04 DIAGNOSIS — F17219 Nicotine dependence, cigarettes, with unspecified nicotine-induced disorders: Secondary | ICD-10-CM

## 2022-10-04 MED ORDER — BUPROPION HCL ER (XL) 150 MG PO TB24
150.0000 mg | ORAL_TABLET | ORAL | 5 refills | Status: AC
Start: 2022-10-04 — End: ?

## 2022-10-17 ENCOUNTER — Telehealth: Payer: Self-pay | Admitting: Family

## 2022-10-17 NOTE — Telephone Encounter (Signed)
LVM for Patient to call with current address (Returned Mail)

## 2022-11-20 ENCOUNTER — Encounter (HOSPITAL_BASED_OUTPATIENT_CLINIC_OR_DEPARTMENT_OTHER): Payer: Self-pay | Admitting: Emergency Medicine

## 2022-11-20 ENCOUNTER — Emergency Department (HOSPITAL_BASED_OUTPATIENT_CLINIC_OR_DEPARTMENT_OTHER): Payer: BC Managed Care – PPO | Admitting: Radiology

## 2022-11-20 ENCOUNTER — Emergency Department (HOSPITAL_BASED_OUTPATIENT_CLINIC_OR_DEPARTMENT_OTHER)
Admission: EM | Admit: 2022-11-20 | Discharge: 2022-11-20 | Disposition: A | Discharge status: Home / Self Care | Payer: BC Managed Care – PPO | Attending: Emergency Medicine | Admitting: Emergency Medicine

## 2022-11-20 DIAGNOSIS — M79644 Pain in right finger(s): Secondary | ICD-10-CM | POA: Insufficient documentation

## 2022-11-20 DIAGNOSIS — Z7982 Long term (current) use of aspirin: Secondary | ICD-10-CM | POA: Diagnosis not present

## 2022-11-20 DIAGNOSIS — M79641 Pain in right hand: Secondary | ICD-10-CM | POA: Diagnosis not present

## 2022-11-20 MED ORDER — ACETAMINOPHEN 500 MG PO TABS
1000.0000 mg | ORAL_TABLET | Freq: Once | ORAL | Status: AC
  Administered 2022-11-20: 1000 mg via ORAL
  Filled 2022-11-20: qty 2

## 2022-11-20 NOTE — ED Notes (Signed)
Patient given discharge instructions. Questions were answered. Patient verbalized understanding of discharge instructions and care at home.  

## 2022-11-20 NOTE — ED Provider Notes (Signed)
[place] EMERGENCY DEPARTMENT AT [facility] Provider Note   CSN: [id number] Arrival date & time: 11/20/22  1524     History  Chief Complaint  Patient presents with   Hand Injury    ABEDNEGO KLEIN is a 57 y.o. male presents with concern for pain along the right second digit.  He states he was fixing something in his house about 2 weeks ago, and then felt a pop and pain in his right second finger.  Reports since then, he has episodes of pain when doing certain movements such as removing his cast.  Denies any numbness or tingling in the finger, denies any difficulty with range of motion of his hand or finger.   Hand Injury      Home Medications Prior to Admission medications   Medication Sig Start Date End Date Taking? Authorizing Provider  ASPIRIN 81 PO Take by mouth.    [provider]  Aspirin-Acetaminophen-Caffeine (GOODY HEADACHE PO) Take by mouth.    [provider]  buPROPion (WELLBUTRIN XL) 150 MG 24 hr tablet Take 1 tablet (150 mg total) by mouth as directed. START 150 mg ER PO qam, increase after 7 days to 150 mg ER PO BID. 10/04/22   Dulce Sellar, NP  rosuvastatin (CRESTOR) 10 MG tablet Take 0.5 tablets (5 mg total) by mouth as directed. Start with 1 pill qod x 2w, then 1 pill qd 09/28/22   Dulce Sellar, NP  varenicline (CHANTIX) 1 MG tablet Take 1 tablet (1 mg total) by mouth as directed. Take 1 tab bid or 2 tabs qam. For smoking cessation. 09/22/22   Dulce Sellar, NP      Allergies    Patient has no known allergies.    Review of Systems   Review of Systems  Musculoskeletal:        Right second digit pain    Physical Exam Updated Vital Signs BP (!) 163/85 (BP Location: Left Arm)   Pulse (!) 52   Temp 98.3 F (36.8 C) (Oral)   Resp 20   SpO2 99%  Physical Exam Musculoskeletal:     Comments: Right upper extremity: No deformity, erythema, edema, ecchymoses of the right upper extremity No tenderness palpation of  the radius, ulna, carpal bones, 1st through 5th metacarpals, 1st through 5th proximal, middle, distal phalanges.   Able to flex and extend fully at the elbow, wrist.  Able to fully flex and extend at the 1st through 5th MCPs, PIP, DIPs  Intact sensation of the 1st through 5th digits of the right hand  Able to perform resisted finger abduction, resisted thumb and pointer finger opposition, resisted wrist extension  No palpable click or pop with extension of the right second digit at the MCP  2+ radial pulse bilaterally  Skin:    Comments: No wounds, erythema, edema of the right upper extremity     ED Results / Procedures / Treatments   Labs (all labs ordered are listed, but only abnormal results are displayed) Labs Reviewed - No data to display  EKG None  Radiology DG Hand Complete Right  Result Date: 11/20/2022 CLINICAL DATA:  Right hand injury two weeks ago. Persistent pain and swelling. EXAM: RIGHT HAND - COMPLETE 3+ VIEW COMPARISON:  None Available. FINDINGS: There is no evidence of fracture or dislocation. There is no evidence of arthropathy or other focal bone abnormality. Soft tissues are unremarkable. IMPRESSION: Negative. Electronically Signed   By: Danae Orleans M.D.   On: 11/20/2022 17:16  Procedures Procedures    Medications Ordered in ED Medications  acetaminophen (TYLENOL) tablet 1,000 mg (1,000 mg Oral Given 11/20/22 1843)    ED Course/ Medical Decision Making/ A&P                                 Medical Decision Making Amount and/or Complexity of Data Reviewed Radiology: ordered.  Risk OTC drugs.   57 y.o. male presents to the ED for concern of right second digit pain for 2 weeks  Differential diagnosis includes but is not limited to fracture, dislocation, tendon injury, bone contusion, neuropathy, trigger finger  ED Course:  Patient overall well-appearing, no acute distress.  Reports pain in the second right digit that extends from the second  metacarpal up through the end of the second digit.  This pain only comes on with certain movements such as removing his gas cap.  No tenderness to palpation of the right second metacarpal, or phalanges.  No tenderness palpation over the right second MCP, PIP, DIP.  Able to fully flex and extend at the MCP, PIP, DIP of the right second digit.  Intact sensation of the right second digit.  Neurovascularly intact.  No palpable click, pop, finger does not get stuck with flexion and extension, low concern for trigger finger.  I was not able to reproduce pain on exam today.  X-rays without any signs of fracture dislocation.  No skin wounds, erythema, edema concerning for infection.  Patient appropriate for discharge home at this time with orthopedic follow-up if symptoms have not started to improve in the next week. Patient given Tylenol for pain.   Impression: Episodic right second digit pain  Disposition:  The patient was discharged home with instructions to follow-up with orthopedics if symptoms do not start to improve in the next week.  Tylenol and ibuprofen as needed for pain. Return precautions given.   Imaging Studies ordered: I ordered imaging studies including x-ray right hand I independently visualized the imaging with scope of interpretation limited to determining acute life threatening conditions related to emergency care. Imaging showed no fractures or dislocations I agree with the radiologist interpretation           Final Clinical Impression(s) / ED Diagnoses Final diagnoses:  Finger pain, right    Rx / DC Orders ED Discharge Orders     None         Arabella Merles, PA-C 11/20/22 Gareth Eagle, MD 11/20/22 2016

## 2022-11-20 NOTE — ED Triage Notes (Signed)
Right hand injury x 2 weeks.  Heard a pop, was swelling, now just painful

## 2022-11-20 NOTE — Discharge Instructions (Addendum)
Your hand  x-ray shows no signs of fracture or dislocation. On exam today, the tendon in your finger is intact, I have no concern at this time for a tendon injury.  The nerves of your hand are also intact.  Please follow-up with orthopedic office listed below if symptoms do not start to improve in the next week.  You may do activities as tolerated  You may take up to 1000mg  of tylenol every 6 hours as needed for pain.  Do not take more then 4g per day.  You were given a dose here today, your next dose can be no sooner than 1am.   You may use up to 800mg  ibuprofen every 8 hours as needed for pain.  Do not exceed 2.4g of ibuprofen per day.  Return to the ER for any significant worsening in your pain, if you develop weakness in your hand, any other new or concerning symptoms.

## 2023-06-28 ENCOUNTER — Emergency Department (HOSPITAL_BASED_OUTPATIENT_CLINIC_OR_DEPARTMENT_OTHER)

## 2023-06-28 ENCOUNTER — Emergency Department (HOSPITAL_BASED_OUTPATIENT_CLINIC_OR_DEPARTMENT_OTHER)
Admission: EM | Admit: 2023-06-28 | Discharge: 2023-06-28 | Disposition: A | Discharge status: Home / Self Care | Attending: Emergency Medicine | Admitting: Emergency Medicine

## 2023-06-28 ENCOUNTER — Other Ambulatory Visit: Payer: Self-pay

## 2023-06-28 ENCOUNTER — Encounter (HOSPITAL_BASED_OUTPATIENT_CLINIC_OR_DEPARTMENT_OTHER): Payer: Self-pay | Admitting: Emergency Medicine

## 2023-06-28 DIAGNOSIS — Z7982 Long term (current) use of aspirin: Secondary | ICD-10-CM | POA: Diagnosis not present

## 2023-06-28 DIAGNOSIS — M47817 Spondylosis without myelopathy or radiculopathy, lumbosacral region: Secondary | ICD-10-CM | POA: Diagnosis not present

## 2023-06-28 DIAGNOSIS — R531 Weakness: Secondary | ICD-10-CM | POA: Insufficient documentation

## 2023-06-28 DIAGNOSIS — R3915 Urgency of urination: Secondary | ICD-10-CM | POA: Insufficient documentation

## 2023-06-28 DIAGNOSIS — M48061 Spinal stenosis, lumbar region without neurogenic claudication: Secondary | ICD-10-CM | POA: Diagnosis not present

## 2023-06-28 DIAGNOSIS — F17210 Nicotine dependence, cigarettes, uncomplicated: Secondary | ICD-10-CM | POA: Diagnosis not present

## 2023-06-28 DIAGNOSIS — R29898 Other symptoms and signs involving the musculoskeletal system: Secondary | ICD-10-CM

## 2023-06-28 DIAGNOSIS — R930 Abnormal findings on diagnostic imaging of skull and head, not elsewhere classified: Secondary | ICD-10-CM | POA: Diagnosis not present

## 2023-06-28 DIAGNOSIS — M5127 Other intervertebral disc displacement, lumbosacral region: Secondary | ICD-10-CM | POA: Diagnosis not present

## 2023-06-28 DIAGNOSIS — M47816 Spondylosis without myelopathy or radiculopathy, lumbar region: Secondary | ICD-10-CM | POA: Diagnosis not present

## 2023-06-28 LAB — RESP PANEL BY RT-PCR (RSV, FLU A&B, COVID)  RVPGX2
Influenza A by PCR: NEGATIVE
Influenza B by PCR: NEGATIVE
Resp Syncytial Virus by PCR: NEGATIVE
SARS Coronavirus 2 by RT PCR: NEGATIVE

## 2023-06-28 LAB — BASIC METABOLIC PANEL WITH GFR
Anion gap: 10 (ref 5–15)
BUN: 12 mg/dL (ref 6–20)
CO2: 26 mmol/L (ref 22–32)
Calcium: 9.8 mg/dL (ref 8.9–10.3)
Chloride: 104 mmol/L (ref 98–111)
Creatinine, Ser: 0.93 mg/dL (ref 0.61–1.24)
GFR, Estimated: >60 mL/min (ref >60)
Glucose, Bld: 100 mg/dL — ABNORMAL HIGH (ref 70–99)
Potassium: 4.4 mmol/L (ref 3.5–5.1)
Sodium: 140 mmol/L (ref 135–145)

## 2023-06-28 LAB — HEPATIC FUNCTION PANEL
ALT: 16 U/L (ref 0–44)
AST: 12 U/L — ABNORMAL LOW (ref 15–41)
Albumin: 4.4 g/dL (ref 3.5–5.0)
Alkaline Phosphatase: 73 U/L (ref 38–126)
Bilirubin, Direct: 0.2 mg/dL (ref 0.0–0.2)
Indirect Bilirubin: 0.3 mg/dL (ref 0.3–0.9)
Total Bilirubin: 0.6 mg/dL (ref 0.0–1.2)
Total Protein: 7.4 g/dL (ref 6.5–8.1)

## 2023-06-28 LAB — CBC
HCT: 44.9 % (ref 39.0–52.0)
Hemoglobin: 15.8 g/dL (ref 13.0–17.0)
MCH: 31.9 pg (ref 26.0–34.0)
MCHC: 35.2 g/dL (ref 30.0–36.0)
MCV: 90.7 fL (ref 80.0–100.0)
Platelets: 168 10*3/uL (ref 150–400)
RBC: 4.95 MIL/uL (ref 4.22–5.81)
RDW: 12.7 % (ref 11.5–15.5)
WBC: 8.7 10*3/uL (ref 4.0–10.5)
nRBC: 0 % (ref 0.0–0.2)

## 2023-06-28 LAB — CK: Total CK: 61 U/L (ref 49–397)

## 2023-06-28 MED ORDER — METHYLPREDNISOLONE 4 MG PO TBPK: ORAL_TABLET | ORAL | 0 refills | Status: AC

## 2023-06-28 MED ORDER — GADOBUTROL 1 MMOL/ML IV SOLN
9.0000 mL | Freq: Once | INTRAVENOUS | Status: AC | PRN
  Administered 2023-06-28: 9 mL via INTRAVENOUS

## 2023-06-28 NOTE — Discharge Instructions (Addendum)
 As discussed, the MRI of your low back did show some degenerative changes as well as some disc bulging without significant narrowing or pressing on your spinal cord.  I do not think that your symptoms are likely coming from the spine itself.  Patient is do not seem consistent with stroke so I do not think this, from the brain.  Will trial a steroid taper to treat your symptoms and recommend follow-up with neurology for further workup.  I have sent in a referral for this.  Please do not hesitate to return if the worrisome signs and symptoms we discussed become apparent.

## 2023-06-28 NOTE — ED Provider Notes (Signed)
 [place] [facility] Provider Note   CSN: [id number] Arrival date & time: 06/28/23  1000     History  Chief Complaint  Patient presents with   Extremity Weakness    bilateral    Darrell Matthews is a 58 y.o. male.   Extremity Weakness  58 year old male presents emergency department with complaints of bilateral leg heaviness.  States that it has been present since Sunday.  States that Darrell Matthews works third shift and typically does a lot of walking at his job.  States that Darrell Matthews is felt like his legs are much more "tired" than usual.  States that his legs do feel weak on both sides.  States Darrell Matthews does walks 20 yards and feels like Darrell Matthews has walked 5 miles.  States Darrell Matthews has never had symptoms like this before.  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  Denies any saddle anesthesia, sensory deficits in lower extremities, bowel dysfunction.  Does report having more urinary urgency but is able to make it to the restroom to urinate.  States that a couple days ago, did wake up with chills but has been afebrile.  Denies any cough, nasal congestion, sore throat.  Denies any excessive working out/physical exertion prior to symptom onset.  Denies any prior viral syndrome including GI, respiratory illness.  Past medical history significant for hyperlipidemia, atherosclerosis, kidney stone, substance abuse  Home Medications Prior to Admission medications   Medication Sig Start Date End Date Taking? Authorizing Provider  ASPIRIN  81 PO Take by mouth.    [provider]  Aspirin -Acetaminophen -Caffeine (GOODY HEADACHE PO) Take by mouth.    [provider]  buPROPion  (WELLBUTRIN  XL) 150 MG 24 hr tablet Take 1 tablet (150 mg total) by mouth as directed. START 150 mg ER PO qam, increase after 7 days to 150 mg ER PO BID. 10/04/22   Versa Gore, NP  rosuvastatin  (CRESTOR ) 10 MG tablet Take 0.5 tablets (5 mg total) by mouth  as directed. Start with 1 pill qod x 2w, then 1 pill qd 09/28/22   Versa Gore, NP  varenicline  (CHANTIX ) 1 MG tablet Take 1 tablet (1 mg total) by mouth as directed. Take 1 tab bid or 2 tabs qam. For smoking cessation. 09/22/22   Versa Gore, NP      Allergies    Patient has no known allergies.    Review of Systems   Review of Systems  Musculoskeletal:  Positive for extremity weakness.  All other systems reviewed and are negative.   Physical Exam Updated Vital Signs BP 113/89   Pulse 77   Temp 97.8 F (36.6 C) (Oral)   Resp 16   Wt 74.8 kg   SpO2 91%   BMI 23.01 kg/m  Physical Exam Vitals and nursing note reviewed.  Constitutional:      General: Darrell Matthews is not in acute distress.    Appearance: Darrell Matthews is well-developed.  HENT:     Head: Normocephalic and atraumatic.  Eyes:     Conjunctiva/sclera: Conjunctivae normal.  Cardiovascular:     Rate and Rhythm: Normal rate and regular rhythm.  Pulmonary:     Effort: Pulmonary effort is normal. No respiratory distress.     Breath sounds: Normal breath sounds.  Abdominal:     Palpations: Abdomen is soft.     Tenderness: There is no abdominal tenderness.  Musculoskeletal:        General: No swelling.  Cervical back: Neck supple.     Comments: No obvious tenderness bilateral lower extremities.  Pedal and posterior tibial pulses 2+ bilaterally.  DTR symmetric at patella as well as Achilles 1+ bilaterally.  Patient reporting her independence and obvious gait deviation.  Symmetric strength bilateral lower extremities.  Muscular strength 4 out of 5 ankle dorsi/plantarflexion as well as knee flexion/extension.  Skin:    General: Skin is warm and dry.     Capillary Refill: Capillary refill takes less than 2 seconds.  Neurological:     Mental Status: Darrell Matthews is alert.  Psychiatric:        Mood and Affect: Mood normal.    ED Results / Procedures / Treatments   Labs (all labs ordered are listed, but only abnormal results are  displayed) Labs Reviewed - No data to display  EKG None  Radiology No results found.  Procedures Procedures    Medications Ordered in ED Medications - No data to display  ED Course/ Medical Decision Making/ A&P                                 Medical Decision Making Amount and/or Complexity of Data Reviewed Labs: ordered. Radiology: ordered.  Risk Prescription drug management.   This patient presents to the ED for concern of leg weakness, this involves an extensive number of treatment options, and is a complaint that carries with it a high risk of complications and morbidity.  The differential diagnosis includes CVA, cauda equina, spinal epidural abscess, transverse myelitis, GBS, MS, other   Co morbidities that complicate the patient evaluation  See HPI   Additional history obtained:  Additional history obtained from EMR External records from outside source obtained and reviewed including hospital records   Lab Tests:  I Ordered, and personally interpreted labs.  The pertinent results include: No leukocytosis.  No evidence of anemia.  Platelets within normal range.  No electrolyte abnormalities.  No transaminitis.  No renal dysfunction.  CK within normal limits.  Viral testing negative.   Imaging Studies ordered:  I ordered imaging studies including MRI of the spine I independently visualized and interpreted imaging which showed degenerative changes lumbar spine.  Left paracentral disc protrusion L5-S1 indenting on ventral thecal sac without significant lateral stenosis narrowing or spinal canal stenosis.  Multilevel foraminal stenosis greatest at moderate left L5-S1.  Degenerative endplate changes with discogenic edema L5-S1. I agree with the radiologist interpretation  Cardiac Monitoring: / EKG:  The patient was maintained on a cardiac monitor.  I personally viewed and interpreted the cardiac monitored which showed an underlying rhythm of: Sinus  rhythm   Consultations Obtained:  I requested consultation with attending Dr. Tamela Fake who is in agreement treatment plan going forward   Problem List / ED Course / Critical interventions / Medication management  Leg weakness Reevaluation of the patient  showed that the patient stayed the same I have reviewed the patients home medicines and have made adjustments as needed   Social Determinants of Health:  History of IV drug use.  Cigarette use chronically.   Test / Admission - Considered:  Bilateral leg weakness Vitals signs within normal range and stable throughout visit. Laboratory/imaging studies significant for: See above 58 year old male presents emergency department with complaints of bilateral leg heaviness.  States that it has been present since Sunday.  States that Darrell Matthews works third shift and typically does a lot of walking at his job.  States that Darrell Matthews is felt like his legs are much more "tired" than usual.  States that his legs do feel weak on both sides.  States Darrell Matthews does walks 20 yards and feels like Darrell Matthews has walked 5 miles.  States Darrell Matthews has never had symptoms like this before.  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  Denies any saddle anesthesia, sensory deficits in lower extremities, bowel dysfunction.  Does report having more urinary urgency but is able to make it to the restroom to urinate.  States that a couple days ago, did wake up with chills but has been afebrile.  Denies any cough, nasal congestion, sore throat.  Denies any excessive working out/physical exertion prior to symptom onset.  Denies any prior viral syndrome including GI, respiratory illness. On exam, no midline or paraspinal back pain.  No pulse deficits suggest ischemic limb in lower extremities.  No overlying skin changes concerning for secondary infectious process.  Patient able to ambulate without difficulty in the emergency department room.  Possible decree  strength in knee flexion/extension, ankle dorsi/plantarflexion but seems to be symmetric between bilateral lower extremities.  Given patient's remote history of IV drug use 4+ years ago, increased risk for spinal epidural abscess, MR imaging of patient's lumbar spine performed which did show evidence of degenerative changes as well as left disc protrusion L5-S1 without any lateral stenosis or spinal canal stenosis.  No evidence of cauda equina, spinal epidural abscess or other spinal cord cause of symptoms.  Given bilateral nature of patient's complaint, lower suspicion for CVA or other central process.  Patient without any complaint of recent viral illness and leg weakness not described as a sitting in nature; low suspicion for GBS.  Symptoms do not seem consistent with MG, botulism toxicity.  Unsure of exact etiology of patient's described leg weakness.  Will refer to neurology in the outpatient setting for further assessment regarding leg weakness.  Treatment plan discussed with patient Treatment plan were discussed at length with patient and they knowledge understanding was agreeable to said plan.  Appropriate consultations were made as described in the ED course.  Patient was stable upon admission to the hospital.         Final Clinical Impression(s) / ED Diagnoses Final diagnoses:  None    Rx / DC Orders ED Discharge Orders     None         [place], [place] 06/28/23 1749    Scarlette Currier, MD 06/29/23 [phone]

## 2023-06-28 NOTE — ED Triage Notes (Addendum)
 Bilateral legs weakness x 3 days  , heaviness , no pain no numbness . Ambulatory to triage with steady gait . Alert and  oriented x 4 . Denies back issues .

## 2023-06-30 ENCOUNTER — Encounter: Payer: Self-pay | Admitting: Neurology

## 2023-07-03 ENCOUNTER — Encounter (HOSPITAL_BASED_OUTPATIENT_CLINIC_OR_DEPARTMENT_OTHER): Payer: Self-pay

## 2023-07-03 ENCOUNTER — Other Ambulatory Visit: Payer: Self-pay

## 2023-07-03 ENCOUNTER — Emergency Department (HOSPITAL_BASED_OUTPATIENT_CLINIC_OR_DEPARTMENT_OTHER)
Admission: EM | Admit: 2023-07-03 | Discharge: 2023-07-03 | Disposition: A | Discharge status: Home / Self Care | Attending: Emergency Medicine | Admitting: Emergency Medicine

## 2023-07-03 DIAGNOSIS — R159 Full incontinence of feces: Secondary | ICD-10-CM | POA: Diagnosis not present

## 2023-07-03 DIAGNOSIS — R531 Weakness: Secondary | ICD-10-CM | POA: Insufficient documentation

## 2023-07-03 DIAGNOSIS — Z7982 Long term (current) use of aspirin: Secondary | ICD-10-CM | POA: Diagnosis not present

## 2023-07-03 DIAGNOSIS — D72829 Elevated white blood cell count, unspecified: Secondary | ICD-10-CM | POA: Insufficient documentation

## 2023-07-03 DIAGNOSIS — R350 Frequency of micturition: Secondary | ICD-10-CM | POA: Diagnosis not present

## 2023-07-03 DIAGNOSIS — R32 Unspecified urinary incontinence: Secondary | ICD-10-CM | POA: Diagnosis not present

## 2023-07-03 LAB — CBC WITH DIFFERENTIAL/PLATELET
Abs Immature Granulocytes: 0.05 10*3/uL (ref 0.00–0.07)
Basophils Absolute: 0 10*3/uL (ref 0.0–0.1)
Basophils Relative: 0 %
Eosinophils Absolute: 0 10*3/uL (ref 0.0–0.5)
Eosinophils Relative: 0 %
HCT: 44.2 % (ref 39.0–52.0)
Hemoglobin: 15 g/dL (ref 13.0–17.0)
Immature Granulocytes: 0 %
Lymphocytes Relative: 14 %
Lymphs Abs: 1.6 10*3/uL (ref 0.7–4.0)
MCH: 31.3 pg (ref 26.0–34.0)
MCHC: 33.9 g/dL (ref 30.0–36.0)
MCV: 92.3 fL (ref 80.0–100.0)
Monocytes Absolute: 0.7 10*3/uL (ref 0.1–1.0)
Monocytes Relative: 6 %
Neutro Abs: 9.6 10*3/uL — ABNORMAL HIGH (ref 1.7–7.7)
Neutrophils Relative %: 80 %
Platelets: 187 10*3/uL (ref 150–400)
RBC: 4.79 MIL/uL (ref 4.22–5.81)
RDW: 12.8 % (ref 11.5–15.5)
WBC: 12 10*3/uL — ABNORMAL HIGH (ref 4.0–10.5)
nRBC: 0 % (ref 0.0–0.2)

## 2023-07-03 LAB — URINALYSIS, ROUTINE W REFLEX MICROSCOPIC
Bilirubin Urine: NEGATIVE
Glucose, UA: NEGATIVE mg/dL
Hgb urine dipstick: NEGATIVE
Ketones, ur: NEGATIVE mg/dL
Leukocytes,Ua: NEGATIVE
Nitrite: NEGATIVE
Protein, ur: NEGATIVE mg/dL
Specific Gravity, Urine: 1.02 (ref 1.005–1.030)
pH: 5.5 (ref 5.0–8.0)

## 2023-07-03 LAB — COMPREHENSIVE METABOLIC PANEL WITH GFR
ALT: 13 U/L (ref 0–44)
AST: 10 U/L — ABNORMAL LOW (ref 15–41)
Albumin: 4.7 g/dL (ref 3.5–5.0)
Alkaline Phosphatase: 70 U/L (ref 38–126)
Anion gap: 13 (ref 5–15)
BUN: 26 mg/dL — ABNORMAL HIGH (ref 6–20)
CO2: 25 mmol/L (ref 22–32)
Calcium: 10 mg/dL (ref 8.9–10.3)
Chloride: 101 mmol/L (ref 98–111)
Creatinine, Ser: 0.97 mg/dL (ref 0.61–1.24)
GFR, Estimated: >60 mL/min (ref >60)
Glucose, Bld: 110 mg/dL — ABNORMAL HIGH (ref 70–99)
Potassium: 5 mmol/L (ref 3.5–5.1)
Sodium: 138 mmol/L (ref 135–145)
Total Bilirubin: 0.7 mg/dL (ref 0.0–1.2)
Total Protein: 7.8 g/dL (ref 6.5–8.1)

## 2023-07-03 LAB — C-REACTIVE PROTEIN: CRP: 0.6 mg/dL (ref <1.0)

## 2023-07-03 LAB — SEDIMENTATION RATE: Sed Rate: 4 mm/h (ref 0–16)

## 2023-07-03 MED ORDER — LORAZEPAM 2 MG/ML IJ SOLN: 1.0000 mg | INTRAMUSCULAR | Status: DC | PRN

## 2023-07-03 NOTE — ED Provider Notes (Signed)
 [place] [facility] AT [facility] Provider Note   CSN: [id number] Arrival date & time: 07/03/23  1554     History  Chief Complaint  Patient presents with   Urinary Frequency    NAIM MURTHA is a 58 y.o. male.   Urinary Frequency     58 year old man presenting to the emergency department with bilateral lower extremity weakness and urinary incontinence.  The patient states that his symptoms have been progressing for the past 2 weeks.  He initially stated he had MRI imaging 2 weeks ago that was negative but on chart review the patient has had an MRI on 06/28/2023 which showed evidence of degenerative changes of the lumbar spine with left paracentral disc protrusion at L5-S1 indenting the ventral thecal sac without significant lateral recess narrowing or spinal canal stenosis with multilevel foraminal stenosis, greatest and moderate on the left at L5-S1 and degenerative endplate changes with discogenic edema at L5-S1 which may contribute.  The patient states that he is now urinating on himself occasionally, he endorses bilateral lower extremity weakness.  He denies any new falls or trauma.  He denies any fevers or chills.  He denies any recent IV drug abuse, chart review shows he has a history of IV drug abuse.    Home Medications Prior to Admission medications   Medication Sig Start Date End Date Taking? Authorizing Provider  ASPIRIN  81 PO Take by mouth.    [provider]  Aspirin -Acetaminophen -Caffeine (GOODY HEADACHE PO) Take by mouth.    [provider]  buPROPion  (WELLBUTRIN  XL) 150 MG 24 hr tablet Take 1 tablet (150 mg total) by mouth as directed. START 150 mg ER PO qam, increase after 7 days to 150 mg ER PO BID. 10/04/22   Versa Gore, NP  methylPREDNISolone  (MEDROL  DOSEPAK) 4 MG TBPK tablet See pack instructions 06/28/23   Neil Balls A, PA  rosuvastatin  (CRESTOR ) 10 MG tablet Take 0.5 tablets (5 mg total) by mouth as directed.  Start with 1 pill qod x 2w, then 1 pill qd 09/28/22   Versa Gore, NP  varenicline  (CHANTIX ) 1 MG tablet Take 1 tablet (1 mg total) by mouth as directed. Take 1 tab bid or 2 tabs qam. For smoking cessation. 09/22/22   Versa Gore, NP      Allergies    Patient has no known allergies.    Review of Systems   Review of Systems  Genitourinary:  Positive for frequency.  All other systems reviewed and are negative.   Physical Exam Updated Vital Signs BP (!) 155/89 (BP Location: Right Arm)   Pulse 72   Temp 98.6 F (37 C)   Resp 16   Ht 5\' 10"  (1.778 m)   Wt 76.2 kg   SpO2 96%   BMI 24.11 kg/m  Physical Exam Vitals and nursing note reviewed.  Constitutional:      General: He is not in acute distress. HENT:     Head: Normocephalic and atraumatic.  Eyes:     Conjunctiva/sclera: Conjunctivae normal.     Pupils: Pupils are equal, round, and reactive to light.  Cardiovascular:     Rate and Rhythm: Normal rate and regular rhythm.  Pulmonary:     Effort: Pulmonary effort is normal. No respiratory distress.  Abdominal:     General: There is no distension.     Tenderness: There is no guarding.  Musculoskeletal:        General: No deformity or signs of injury.  Cervical back: Neck supple.     Comments: No midline TTP of the cervical thoracic or lumbar spine  Skin:    Findings: No lesion or rash.  Neurological:     Mental Status: He is alert.     Cranial Nerves: No cranial nerve deficit.     Deep Tendon Reflexes:     Reflex Scores:      Patellar reflexes are 2+ on the right side and 2+ on the left side.    Comments: 5/5 strength and sensation intact of the bilat upper extremities. 4/5 strength in the bilat lower extremities. 2+ patellar reflexes bilaterally     ED Results / Procedures / Treatments   Labs (all labs ordered are listed, but only abnormal results are displayed) Labs Reviewed  CBC WITH DIFFERENTIAL/PLATELET - Abnormal; Notable for the following  components:      Result Value   WBC 12.0 (*)    Neutro Abs 9.6 (*)    All other components within normal limits  COMPREHENSIVE METABOLIC PANEL WITH GFR - Abnormal; Notable for the following components:   Glucose, Bld 110 (*)    BUN 26 (*)    AST 10 (*)    All other components within normal limits  URINALYSIS, ROUTINE W REFLEX MICROSCOPIC  SEDIMENTATION RATE  C-REACTIVE PROTEIN    EKG None  Radiology No results found.  Procedures Procedures    Medications Ordered in ED Medications  LORazepam  (ATIVAN ) injection 1 mg (has no administration in time range)    ED Course/ Medical Decision Making/ A&P                                 Medical Decision Making Amount and/or Complexity of Data Reviewed Labs: ordered. Radiology: ordered.  Risk Prescription drug management.     58 year old man presenting to the emergency department with bilateral lower extremity weakness and urinary incontinence.  The patient states that his symptoms have been progressing for the past 2 weeks.  He initially stated he had MRI imaging 2 weeks ago that was negative but on chart review the patient has had an MRI on 06/28/2023 which showed evidence of degenerative changes of the lumbar spine with left paracentral disc protrusion at L5-S1 indenting the ventral thecal sac without significant lateral recess narrowing or spinal canal stenosis with multilevel foraminal stenosis, greatest and moderate on the left at L5-S1 and degenerative endplate changes with discogenic edema at L5-S1 which may contribute.  The patient states that he is now urinating on himself, he endorses bilateral lower extremity weakness.  He denies any new falls or trauma.  He denies any fevers or chills.  He denies any recent IV drug abuse, chart review shows he has a history of IV drug abuse.    On arrival, the patient was afebrile, not tachycardic or tachypneic, hemodynamically stable.  Physical exam revealed bilateral lower extremity  weakness, 4/5 with intact patellar reflexes 2+.   Labs: UA negative for hematuria or UTI. CBC with a mild leukocytosis to 12.0. Pt just finished a medrol  dosepak. CMP unremarkable. ESR and CRP pending, will not immediately result as they are a sendout.  Neurosurgery consult: Spoke with Dr. Gwendlyn Lemmings while he was in the OR, mentioned that the patient is now having incontinence. He review the recent MRI from 06/28/23 and recommended follow-up in the office. Bladder scan was normal, no concern for urinary retention. Pt is pain free, afebrile, able to  successfully ambulate. 5/5 strength noted on repeat exam with intact patellar reflexes.  Return precautions provided in the event he develops worsening symptoms, concerning symptoms associated with cauda equina syndrome. Pt endorsed understanding, stable for DC and close outpatient neurosurgery follow-up.   Final Clinical Impression(s) / ED Diagnoses Final diagnoses:  Weakness  Urinary incontinence, unspecified type    Rx / DC Orders ED Discharge Orders          Ordered    Ambulatory referral to Neurosurgery        07/03/23 1757              Rosealee Concha, MD 07/03/23 1805

## 2023-07-03 NOTE — Discharge Instructions (Addendum)
 Neurosurgery evaluated your recent MRI imaging and presentation and recommend close follow-up in clinic.  Please return to the emergency department if you develop progressively worsening lower extremity weakness, fevers, worsening back pain, numbness in your groin in an area where you would sit on a saddle, fever and worsening back pain, or new falls/trauma to the back.

## 2023-07-03 NOTE — ED Triage Notes (Signed)
 Pt reports increased frequency x2 weeks. Pt reports hx of kidney stones but denies any pain. Pt also reports increased bilateral weakness in legs and pain in legs. Pt ambulatory in triage.

## 2023-08-22 ENCOUNTER — Encounter: Payer: Self-pay | Admitting: Neurology

## 2023-08-22 ENCOUNTER — Ambulatory Visit: Admitting: Neurology

## 2023-08-22 DIAGNOSIS — Z029 Encounter for administrative examinations, unspecified: Secondary | ICD-10-CM

## 2023-09-04 ENCOUNTER — Other Ambulatory Visit: Payer: Self-pay | Admitting: Family

## 2023-09-04 DIAGNOSIS — E782 Mixed hyperlipidemia: Secondary | ICD-10-CM

## 2023-10-31 ENCOUNTER — Other Ambulatory Visit: Payer: Self-pay | Admitting: Family

## 2023-10-31 DIAGNOSIS — E782 Mixed hyperlipidemia: Secondary | ICD-10-CM

## 2024-02-08 ENCOUNTER — Other Ambulatory Visit: Payer: Self-pay

## 2024-02-08 ENCOUNTER — Emergency Department (HOSPITAL_BASED_OUTPATIENT_CLINIC_OR_DEPARTMENT_OTHER)
Admission: EM | Admit: 2024-02-08 | Discharge: 2024-02-08 | Disposition: A | Discharge status: Home / Self Care | Attending: Emergency Medicine | Admitting: Emergency Medicine

## 2024-02-08 ENCOUNTER — Encounter (HOSPITAL_BASED_OUTPATIENT_CLINIC_OR_DEPARTMENT_OTHER): Payer: Self-pay

## 2024-02-08 DIAGNOSIS — Z7982 Long term (current) use of aspirin: Secondary | ICD-10-CM | POA: Insufficient documentation

## 2024-02-08 DIAGNOSIS — J111 Influenza due to unidentified influenza virus with other respiratory manifestations: Secondary | ICD-10-CM | POA: Diagnosis not present

## 2024-02-08 DIAGNOSIS — R509 Fever, unspecified: Secondary | ICD-10-CM | POA: Diagnosis present

## 2024-02-08 DIAGNOSIS — Z79899 Other long term (current) drug therapy: Secondary | ICD-10-CM | POA: Diagnosis not present

## 2024-02-08 NOTE — ED Triage Notes (Signed)
 Presents to ED for flu symptoms. Pt states he's had a fever since Monday (tmax 101F). Been alternating tylenol  and advil  Q4hours. Denies N/V/D. Been exposed to flu and COVID at work

## 2024-02-08 NOTE — ED Provider Notes (Signed)
 " Darrell Matthews Provider Note   CSN: [id number] Arrival date & time: 02/08/24  9166     Patient presents with: Fever   Darrell Matthews is a 59 y.o. male.   Patient with history of tobacco use presents to the emergency department today for evaluation of flulike illness.  Today is day 3 of illness.  Patient reports having fever, body aches, sore throat, cough, sneezing.  He had 1 episode of nausea but no vomiting.  He has been taking ibuprofen  and Tylenol .  Also took 1 dose of DayQuil.  He reports sick contacts at work, including a friend who has flu and COVID.  No shortness of breath.       Prior to Admission medications  Medication Sig Start Date End Date Taking? Authorizing Provider  ASPIRIN  81 PO Take by mouth.    [provider]  Aspirin -Acetaminophen -Caffeine (GOODY HEADACHE PO) Take by mouth.    [provider]  buPROPion  (WELLBUTRIN  XL) 150 MG 24 hr tablet Take 1 tablet (150 mg total) by mouth as directed. START 150 mg ER PO qam, increase after 7 days to 150 mg ER PO BID. 10/04/22   Lucius Krabbe, NP  methylPREDNISolone  (MEDROL  DOSEPAK) 4 MG TBPK tablet See pack instructions 06/28/23   Silver Fell A, PA  rosuvastatin  (CRESTOR ) 10 MG tablet TAKE 0.5 TABLETS (5 MG TOTAL) BY MOUTH AS DIRECTED. START WITH 1 PILL EVERY OTHER DAY FOR 2WEEKS, THEN 1 PILL DAILY 10/31/23   Lucius Krabbe, NP  varenicline  (CHANTIX ) 1 MG tablet Take 1 tablet (1 mg total) by mouth as directed. Take 1 tab bid or 2 tabs qam. For smoking cessation. 09/22/22   Lucius Krabbe, NP    Allergies: Patient has no known allergies.    Review of Systems  Updated Vital Signs BP (!) 156/89 (BP Location: Right Arm)   Pulse 81   Temp 97.9 F (36.6 C) (Oral)   Resp 18   Ht 5' 11 (1.803 m)   Wt 80.7 kg   SpO2 97%   BMI 24.83 kg/m   Physical Exam Vitals and nursing note reviewed.  Constitutional:      Appearance: He is well-developed.  HENT:      Head: Normocephalic and atraumatic.     Jaw: No trismus.     Right Ear: Tympanic membrane, ear canal and external ear normal.     Left Ear: Tympanic membrane, ear canal and external ear normal.     Nose: Congestion present. No mucosal edema or rhinorrhea.     Mouth/Throat:     Mouth: Mucous membranes are not dry.     Pharynx: Uvula midline. Posterior oropharyngeal erythema present. No oropharyngeal exudate or uvula swelling.     Tonsils: No tonsillar abscesses.  Eyes:     General:        Right eye: No discharge.        Left eye: No discharge.     Conjunctiva/sclera: Conjunctivae normal.  Cardiovascular:     Rate and Rhythm: Normal rate and regular rhythm.     Heart sounds: Normal heart sounds.  Pulmonary:     Effort: Pulmonary effort is normal. No respiratory distress.     Breath sounds: Normal breath sounds. No wheezing or rales.  Abdominal:     Palpations: Abdomen is soft.     Tenderness: There is no abdominal tenderness.  Musculoskeletal:     Cervical back: Normal range of motion and neck supple.  Skin:  General: Skin is warm and dry.  Neurological:     Mental Status: He is alert.     (all labs ordered are listed, but only abnormal results are displayed) Labs Reviewed - No data to display  EKG: None  Radiology: No results found.   Procedures   Medications Ordered in the ED - No data to display  ED Course  Patient seen and examined. History obtained directly from patient.    Labs/EKG: None ordered  Imaging: None ordered  Medications/Fluids: None ordered  Most recent vital signs reviewed and are as follows: BP (!) 156/89 (BP Location: Right Arm)   Pulse 81   Temp 97.9 F (36.6 C) (Oral)   Resp 18   Ht 5' 11 (1.803 m)   Wt 80.7 kg   SpO2 97%   BMI 24.83 kg/m   Initial impression: Influenza-like illness  Home treatment plan: Over-the-counter medications as directed on the packaging, rest, hydration  Return instructions discussed with  patient: Return with increasing shortness of breath, increased work of breathing, persistent vomiting, fever/confusion.  Follow-up instructions discussed with patient: Primary care in 5 days if not improving.                                   Medical Decision Making  Patient presents with flulike symptoms.   Patient is well-appearing.  Vital signs are stable, fevers controlled.  No clinical signs and symptoms of significant dehydration and is currently tolerating fluids.  Considered secondary infection such as pneumonia, however lungs are clear on exam and patient without hypoxia.  Low concern for sepsis.  In addition, low clinical concern for mononucleosis.   Given well appearance, supportive therapy indicated currently.  Discussed signs and symptoms to return with patient at bedside.  Patient seems reliable to return as needed.       Final diagnoses:  Influenza-like illness    ED Discharge Orders     None          Desiderio Chew, PA-C 02/08/24 0915    Cottie Donnice PARAS, MD 02/08/24 1102  "

## 2024-02-08 NOTE — Discharge Instructions (Signed)
 Please read and follow all provided instructions.  Your diagnoses today include:  1. Influenza-like illness    Tests performed today include: Vital signs. See below for your results today.   Medications prescribed:  Please use over-the-counter NSAID medications (ibuprofen , naproxen ) or Tylenol  (acetaminophen ) as directed on the packaging for pain -- as long as you do not have any reasons avoid these medications. Reasons to avoid NSAID medications include: weak kidneys, a history of bleeding in your stomach or gut, or uncontrolled high blood pressure or previous heart attack. Reasons to avoid Tylenol  include: liver problems or ongoing alcohol use. Never take more than 4000mg  or 8 Extra strength Tylenol  in a 24 hour period.     Take any prescribed medications only as directed.  Home care instructions:  Follow any educational materials contained in this packet. Please continue drinking plenty of fluids. Use over-the-counter cold and flu medications as needed as directed on packaging for symptom relief. You may also use ibuprofen  or tylenol  as directed on packaging for pain or fever.   BE VERY CAREFUL not to take multiple medicines containing Tylenol  (also called acetaminophen ). Doing so can lead to an overdose which can damage your liver and cause liver failure and possibly death.   Follow-up instructions: Please follow-up with your primary care provider in the next 5 days for further evaluation of your symptoms if not improving.   Return instructions:  Please return to the Emergency Department if you experience worsening symptoms. Please return if you have a high fever greater than 101 degrees not controlled with over-the-counter medications, persistent vomiting and cannot keep down fluids, or worsening trouble breathing. Please return if you have any other emergent concerns.  Additional Information:  Your vital signs today were: BP (!) 156/89 (BP Location: Right Arm)   Pulse 81   Temp  97.9 F (36.6 C) (Oral)   Resp 18   Ht 5' 11 (1.803 m)   Wt 80.7 kg   SpO2 97%   BMI 24.83 kg/m  If your blood pressure (BP) was elevated above 135/85 this visit, please have this repeated by your doctor within one month.

## 2024-02-09 ENCOUNTER — Telehealth: Payer: Self-pay

## 2024-02-09 NOTE — Telephone Encounter (Signed)
 Opened in error

## 2024-02-09 NOTE — Telephone Encounter (Signed)
 Transition Care Management Unsuccessful Follow-up Telephone Call  Date of discharge and from where:  02/08/24 [facility]  Attempts:  1st Attempt  Reason for unsuccessful TCM follow-up call:  Unable to leave message; please schedule pt for appt if needed per discharge from ED

## 2024-02-13 ENCOUNTER — Telehealth: Payer: Self-pay

## 2024-02-13 NOTE — Telephone Encounter (Signed)
 Transition Care Management Unsuccessful Follow-up Telephone Call  Date of discharge and from where:  02/08/24 [facility]  Attempts:  2nd Attempt  Reason for unsuccessful TCM follow-up call:  Unable to reach patient; after several rings received an automated message stating phone is not in service.
